# Patient Record
Sex: Male | Born: 1945 | Race: White | Hispanic: No | Smoking: Former smoker
Health system: Southern US, Community
[De-identification: ages and names within clinical notes are randomized; demographics above are authoritative.]

## PROBLEM LIST (undated history)

## (undated) DIAGNOSIS — G4730 Sleep apnea, unspecified: Secondary | ICD-10-CM

## (undated) DIAGNOSIS — H269 Unspecified cataract: Secondary | ICD-10-CM

## (undated) DIAGNOSIS — T7840XA Allergy, unspecified, initial encounter: Secondary | ICD-10-CM

## (undated) DIAGNOSIS — E785 Hyperlipidemia, unspecified: Secondary | ICD-10-CM

## (undated) DIAGNOSIS — J45909 Unspecified asthma, uncomplicated: Secondary | ICD-10-CM

## (undated) HISTORY — DX: Unspecified cataract: H26.9

## (undated) HISTORY — PX: POLYPECTOMY: SHX149

## (undated) HISTORY — PX: COLONOSCOPY: SHX174

## (undated) HISTORY — DX: Hyperlipidemia, unspecified: E78.5

## (undated) HISTORY — DX: Allergy, unspecified, initial encounter: T78.40XA

## (undated) HISTORY — PX: INGUINAL HERNIA REPAIR: SUR1180

## (undated) HISTORY — DX: Sleep apnea, unspecified: G47.30

---

## 2003-04-25 ENCOUNTER — Encounter (INDEPENDENT_AMBULATORY_CARE_PROVIDER_SITE_OTHER): Payer: Self-pay | Admitting: Specialist

## 2003-04-25 ENCOUNTER — Ambulatory Visit (HOSPITAL_COMMUNITY): Admission: RE | Admit: 2003-04-25 | Discharge: 2003-04-25 | Payer: Self-pay | Admitting: Gastroenterology

## 2004-04-03 ENCOUNTER — Ambulatory Visit: Payer: Self-pay | Admitting: Gastroenterology

## 2004-04-16 ENCOUNTER — Ambulatory Visit: Payer: Self-pay | Admitting: Gastroenterology

## 2007-05-02 ENCOUNTER — Ambulatory Visit: Payer: Self-pay | Admitting: Gastroenterology

## 2007-05-16 ENCOUNTER — Ambulatory Visit: Payer: Self-pay | Admitting: Gastroenterology

## 2007-05-16 ENCOUNTER — Encounter: Payer: Self-pay | Admitting: Gastroenterology

## 2012-05-24 ENCOUNTER — Encounter: Payer: Self-pay | Admitting: Gastroenterology

## 2012-05-30 ENCOUNTER — Encounter: Payer: Self-pay | Admitting: Gastroenterology

## 2012-06-28 ENCOUNTER — Ambulatory Visit (AMBULATORY_SURGERY_CENTER): Payer: 59 | Admitting: *Deleted

## 2012-06-28 VITALS — Ht 72.0 in | Wt 230.2 lb

## 2012-06-28 DIAGNOSIS — Z1211 Encounter for screening for malignant neoplasm of colon: Secondary | ICD-10-CM

## 2012-06-28 MED ORDER — NA SULFATE-K SULFATE-MG SULF 17.5-3.13-1.6 GM/177ML PO SOLN: ORAL | Status: DC

## 2012-06-29 ENCOUNTER — Encounter: Payer: Self-pay | Admitting: Gastroenterology

## 2012-07-13 ENCOUNTER — Ambulatory Visit (AMBULATORY_SURGERY_CENTER): Payer: 59 | Admitting: Gastroenterology

## 2012-07-13 ENCOUNTER — Encounter: Payer: Self-pay | Admitting: Gastroenterology

## 2012-07-13 VITALS — BP 133/73 | HR 59 | Temp 97.0°F | Resp 26 | Ht 72.0 in | Wt 230.0 lb

## 2012-07-13 DIAGNOSIS — Z1211 Encounter for screening for malignant neoplasm of colon: Secondary | ICD-10-CM

## 2012-07-13 DIAGNOSIS — D126 Benign neoplasm of colon, unspecified: Secondary | ICD-10-CM

## 2012-07-13 MED ORDER — SODIUM CHLORIDE 0.9 % IV SOLN: 500.0000 mL | INTRAVENOUS | Status: DC

## 2012-07-13 NOTE — Progress Notes (Signed)
Patient did not experience any of the following events: a burn prior to discharge; a fall within the facility; wrong site/side/patient/procedure/implant event; or a hospital transfer or hospital admission upon discharge from the facility. (G8907) Patient did not have preoperative order for IV antibiotic SSI prophylaxis. (G8918)  

## 2012-07-13 NOTE — Progress Notes (Signed)
Called to room to assist during endoscopic procedure.  Patient ID and intended procedure confirmed with present staff. Received instructions for my participation in the procedure from the performing physician.  

## 2012-07-13 NOTE — Progress Notes (Signed)
Lidocaine-40mg IV prior to Propofol InductionPropofol given over incremental dosages 

## 2012-07-13 NOTE — Patient Instructions (Addendum)
Discharge instructions given with verbal understanding. Handout on polyp given. Resume previous medications. YOU HAD AN ENDOSCOPIC PROCEDURE TODAY AT THE [facility]: Refer to the procedure report that was given to you for any specific questions about what was found during the examination.  If the procedure report does not answer your questions, please call your gastroenterologist to clarify.  If you requested that your care partner not be given the details of your procedure findings, then the procedure report has been included in a sealed envelope for you to review at your convenience later.  YOU SHOULD EXPECT: Some feelings of bloating in the abdomen. Passage of more gas than usual.  Walking can help get rid of the air that was put into your GI tract during the procedure and reduce the bloating. If you had a lower endoscopy (such as a colonoscopy or flexible sigmoidoscopy) you may notice spotting of blood in your stool or on the toilet paper. If you underwent a bowel prep for your procedure, then you may not have a normal bowel movement for a few days.  DIET: Your first meal following the procedure should be a light meal and then it is ok to progress to your normal diet.  A half-sandwich or bowl of soup is an example of a good first meal.  Heavy or fried foods are harder to digest and may make you feel nauseous or bloated.  Likewise meals heavy in dairy and vegetables can cause extra gas to form and this can also increase the bloating.  Drink plenty of fluids but you should avoid alcoholic beverages for 24 hours.  ACTIVITY: Your care partner should take you home directly after the procedure.  You should plan to take it easy, moving slowly for the rest of the day.  You can resume normal activity the day after the procedure however you should NOT DRIVE or use heavy machinery for 24 hours (because of the sedation medicines used during the test).    SYMPTOMS TO REPORT IMMEDIATELY: A  gastroenterologist can be reached at any hour.  During normal business hours, 8:30 AM to 5:00 PM Monday through Friday, call [phone].  After hours and on weekends, please call the GI answering service at [phone] who will take a message and have the physician on call contact you.   Following lower endoscopy (colonoscopy or flexible sigmoidoscopy):  Excessive amounts of blood in the stool  Significant tenderness or worsening of abdominal pains  Swelling of the abdomen that is new, acute  Fever of 100F or higher   FOLLOW UP: If any biopsies were taken you will be contacted by phone or by letter within the next 1-3 weeks.  Call your gastroenterologist if you have not heard about the biopsies in 3 weeks.  Our staff will call the home number listed on your records the next business day following your procedure to check on you and address any questions or concerns that you may have at that time regarding the information given to you following your procedure. This is a courtesy call and so if there is no answer at the home number and we have not heard from you through the emergency physician on call, we will assume that you have returned to your regular daily activities without incident.  SIGNATURES/CONFIDENTIALITY: You and/or your care partner have signed paperwork which will be entered into your electronic medical record.  These signatures attest to the fact that that the information above on your After Visit Summary   has been reviewed and is understood.  Full responsibility of the confidentiality of this discharge information lies with you and/or your care-partner.  

## 2012-07-13 NOTE — Op Note (Addendum)
[facility] [address] [place], [address]   COLONOSCOPY PROCEDURE REPORT  PATIENT: Cody Michael, Cody Michael  MR#: [id number] BIRTHDATE: 1945/10/07 , 67  yrs. old GENDER: Male ENDOSCOPIST: Louis Meckel, MD REFERRED BY: Dr. Diannia Ruder Husin PROCEDURE DATE:  07/13/2012 PROCEDURE:   Colonoscopy with snare polypectomy ASA CLASS:   Class I INDICATIONS:average risk screening. MEDICATIONS: MAC sedation, administered by CRNA and propofol (Diprivan) 250mg  IV  DESCRIPTION OF PROCEDURE:   After the risks benefits and alternatives of the procedure were thoroughly explained, informed consent was obtained.  A digital rectal exam revealed no abnormalities of the rectum.   The LB [id number]  endoscope was introduced through the anus and advanced to the cecum, which was identified by both the appendix and ileocecal valve. No adverse events experienced.   The quality of the prep was excellent using Suprep  The instrument was then slowly withdrawn as the colon was fully examined.      COLON FINDINGS: Two sessile polyps measuring 3-10 mm in size were found in the descending colon.  A polypectomy was performed with a cold snare.  The resection was complete and the polyp tissue was completely retrieved.   The colon mucosa was otherwise normal. Retroflexed views revealed no abnormalities. The time to cecum=3 minutes 12 seconds.  Withdrawal time=8 minutes 20 seconds.  The scope was withdrawn and the procedure completed. COMPLICATIONS: There were no complications.  ENDOSCOPIC IMPRESSION: 1.   Two sessile polyps measuring 3-10 mm in size were found in the descending colon; polypectomy was performed with a cold snare 2.   The colon mucosa was otherwise normal  RECOMMENDATIONS: If the polyp(s) removed today are proven to be adenomatous (pre-cancerous) polyps, you will need a repeat colonoscopy in 5 years.  Otherwise you should continue to follow colorectal cancer screening  guidelines for "routine risk" patients with colonoscopy in 10 years.  You will receive a letter within 1-2 weeks with the results of your biopsy as well as final recommendations.  Please call my office if you have not received a letter after 3 weeks.   eSigned:  Louis Meckel, MD 07/13/2012 9:49 AM Revised: 07/13/2012 9:49 AM  cc:   PATIENT NAME:  [name], [name] MR#: [id number]

## 2012-07-14 ENCOUNTER — Telehealth: Payer: Self-pay

## 2012-07-14 NOTE — Telephone Encounter (Signed)
  Follow up Call-  Call back number 07/13/2012  Post procedure Call Back phone  # [phone]  Permission to leave phone message Yes     Patient questions:  Do you have a fever, pain , or abdominal swelling? no Pain Score  0 *  Have you tolerated food without any problems? yes  Have you been able to return to your normal activities? yes  Do you have any questions about your discharge instructions: Diet   no Medications  no Follow up visit  no  Do you have questions or concerns about your Care? no  Actions: * If pain score is 4 or above: No action needed, pain <4.

## 2012-07-24 ENCOUNTER — Encounter: Payer: Self-pay | Admitting: Gastroenterology

## 2014-01-14 ENCOUNTER — Telehealth: Payer: Self-pay | Admitting: *Deleted

## 2014-01-14 NOTE — Telephone Encounter (Signed)
Pt's wife called states she would like to order 2nd pr of orthotics from [facility] # [phone].  Orders called to [facility] at [facility] [phone].

## 2014-02-20 DIAGNOSIS — M722 Plantar fascial fibromatosis: Secondary | ICD-10-CM

## 2014-05-13 ENCOUNTER — Other Ambulatory Visit: Payer: Self-pay | Admitting: Internal Medicine

## 2014-05-13 DIAGNOSIS — R0989 Other specified symptoms and signs involving the circulatory and respiratory systems: Secondary | ICD-10-CM

## 2014-05-13 DIAGNOSIS — Z136 Encounter for screening for cardiovascular disorders: Secondary | ICD-10-CM

## 2014-05-22 ENCOUNTER — Ambulatory Visit
Admission: RE | Admit: 2014-05-22 | Discharge: 2014-05-22 | Disposition: A | Discharge status: Home / Self Care | Payer: 59 | Source: Ambulatory Visit | Attending: Internal Medicine | Admitting: Internal Medicine

## 2014-05-22 DIAGNOSIS — Z136 Encounter for screening for cardiovascular disorders: Secondary | ICD-10-CM

## 2014-05-22 DIAGNOSIS — R0989 Other specified symptoms and signs involving the circulatory and respiratory systems: Secondary | ICD-10-CM

## 2017-06-29 ENCOUNTER — Encounter: Payer: Self-pay | Admitting: Gastroenterology

## 2017-07-06 ENCOUNTER — Encounter: Payer: Self-pay | Admitting: Gastroenterology

## 2017-08-31 ENCOUNTER — Ambulatory Visit (AMBULATORY_SURGERY_CENTER): Payer: Self-pay

## 2017-08-31 ENCOUNTER — Other Ambulatory Visit: Payer: Self-pay

## 2017-08-31 VITALS — Ht 72.0 in | Wt 239.8 lb

## 2017-08-31 DIAGNOSIS — Z86010 Personal history of colonic polyps: Secondary | ICD-10-CM

## 2017-08-31 MED ORDER — NA SULFATE-K SULFATE-MG SULF 17.5-3.13-1.6 GM/177ML PO SOLN: 1.0000 | Freq: Once | ORAL | 0 refills | Status: AC

## 2017-08-31 NOTE — Progress Notes (Signed)
No egg or soy allergy known to patient  No issues with past sedation with any surgeries  or procedures, no intubation problems  No diet pills per patient No home 02 use per patient  No blood thinners per patient  Pt denies issues with constipation  No A fib or A flutter  EMMI video sent to pt's e mail , pt declined    

## 2017-09-01 ENCOUNTER — Encounter: Payer: Self-pay | Admitting: Gastroenterology

## 2017-09-14 ENCOUNTER — Ambulatory Visit (AMBULATORY_SURGERY_CENTER): Payer: Medicare Other | Admitting: Gastroenterology

## 2017-09-14 VITALS — BP 127/73 | HR 64 | Temp 98.7°F | Resp 16

## 2017-09-14 DIAGNOSIS — D125 Benign neoplasm of sigmoid colon: Secondary | ICD-10-CM | POA: Diagnosis not present

## 2017-09-14 DIAGNOSIS — Z86010 Personal history of colonic polyps: Secondary | ICD-10-CM

## 2017-09-14 DIAGNOSIS — D120 Benign neoplasm of cecum: Secondary | ICD-10-CM | POA: Diagnosis not present

## 2017-09-14 DIAGNOSIS — D123 Benign neoplasm of transverse colon: Secondary | ICD-10-CM

## 2017-09-14 DIAGNOSIS — D122 Benign neoplasm of ascending colon: Secondary | ICD-10-CM | POA: Diagnosis not present

## 2017-09-14 MED ORDER — SODIUM CHLORIDE 0.9 % IV SOLN: 500.0000 mL | Freq: Once | INTRAVENOUS | Status: DC

## 2017-09-14 NOTE — Op Note (Signed)
[facility] Patient Name: Cody Michael Procedure Date: 09/14/2017 9:03 AM MRN: [id number] Endoscopist: Remo Lipps P. Havery Moros , MD Age: 72 Referring MD:  Date of Birth: [birth year] Gender: Male Account #: [id number] Procedure:                Colonoscopy Indications:              Surveillance: Personal history of adenomatous                            polyps on last colonoscopy 5 years ago Medicines:                Monitored Anesthesia Care Procedure:                Pre-Anesthesia Assessment:                           - Prior to the procedure, a History and Physical                            was performed, and patient medications and                            allergies were reviewed. The patient's tolerance of                            previous anesthesia was also reviewed. The risks                            and benefits of the procedure and the sedation                            options and risks were discussed with the patient.                            All questions were answered, and informed consent                            was obtained. Prior Anticoagulants: The patient has                            taken no previous anticoagulant or antiplatelet                            agents. ASA Grade Assessment: II - A patient with                            mild systemic disease. After reviewing the risks                            and benefits, the patient was deemed in                            satisfactory condition to undergo the procedure.  After obtaining informed consent, the colonoscope                            was passed under direct vision. Throughout the                            procedure, the patient's blood pressure, pulse, and                            oxygen saturations were monitored continuously. The                            Colonoscope was introduced through the anus and                            advanced to the the  cecum, identified by                            appendiceal orifice and ileocecal valve. The                            colonoscopy was performed without difficulty. The                            patient tolerated the procedure well. The quality                            of the bowel preparation was good. The ileocecal                            valve, appendiceal orifice, and rectum were                            photographed. Scope In: 9:09:52 AM Scope Out: 9:51:16 AM Scope Withdrawal Time: 0 hours 37 minutes 9 seconds  Total Procedure Duration: 0 hours 41 minutes 24 seconds  Findings:                 The perianal and digital rectal examinations were                            normal.                           A 3 mm polyp was found in the cecum. The polyp was                            sessile. The polyp was removed with a cold snare.                            Resection and retrieval were complete.                           Two sessile polyps were found in the ascending  colon. The polyps were diminutive in size. These                            polyps were removed with a cold biopsy forceps.                            Resection and retrieval were complete.                           Nine sessile polyps were found in the ascending                            colon. The polyps were 3 to 5 mm in size. These                            polyps were removed with a cold snare. Resection                            and retrieval were complete.                           Eight sessile polyps were found in the transverse                            colon. The polyps were 3 to 7 mm in size. These                            polyps were removed with a cold snare. Resection                            and retrieval were complete.                           Multiple small-mouthed diverticula were found in                            the sigmoid colon, descending colon,  ascending                            colon and cecum.                           A 4 mm polyp was found in the sigmoid colon. The                            polyp was sessile. The polyp was removed with a                            cold snare. Resection and retrieval were complete.                           The right colon was tortous. The exam was otherwise  without abnormality. Complications:            No immediate complications. Estimated blood loss:                            Minimal. Estimated Blood Loss:     Estimated blood loss was minimal. Impression:               - One 3 mm polyp in the cecum, removed with a cold                            snare. Resected and retrieved.                           - Two diminutive polyps in the ascending colon,                            removed with a cold biopsy forceps. Resected and                            retrieved.                           - Nine 3 to 5 mm polyps in the ascending colon,                            removed with a cold snare. Resected and retrieved.                           - Eight 3 to 7 mm polyps in the transverse colon,                            removed with a cold snare. Resected and retrieved.                           - Diverticulosis in the sigmoid colon, in the                            descending colon, in the ascending colon and in the                            cecum.                           - One 4 mm polyp in the sigmoid colon, removed with                            a cold snare. Resected and retrieved.                           - The examination was otherwise normal. Recommendation:           - Patient has a contact number available for  emergencies. The signs and symptoms of potential                            delayed complications were discussed with the                            patient. Return to normal activities tomorrow.                             Written discharge instructions were provided to the                            patient.                           - Resume previous diet.                           - Continue present medications.                           - Await pathology results.                           - Repeat colonoscopy for surveillance based on                            pathology results. Remo Lipps P. Lawarence Meek, MD 09/14/2017 9:57:41 AM This report has been signed electronically.

## 2017-09-14 NOTE — Progress Notes (Signed)
Called to room to assist during endoscopic procedure.  Patient ID and intended procedure confirmed with present staff. Received instructions for my participation in the procedure from the performing physician.  

## 2017-09-14 NOTE — Progress Notes (Signed)
Pt's states no medical or surgical changes since previsit or office visit. 

## 2017-09-14 NOTE — Progress Notes (Signed)
Report to PACU, RN, vss, BBS= Clear.  

## 2017-09-14 NOTE — Patient Instructions (Signed)
   Information on polyps and diverticulosis given to you today  Await pathology results on polyps removed today   YOU HAD AN ENDOSCOPIC PROCEDURE TODAY AT THE [facility]:   Refer to the procedure report that was given to you for any specific questions about what was found during the examination.  If the procedure report does not answer your questions, please call your gastroenterologist to clarify.  If you requested that your care partner not be given the details of your procedure findings, then the procedure report has been included in a sealed envelope for you to review at your convenience later.  YOU SHOULD EXPECT: Some feelings of bloating in the abdomen. Passage of more gas than usual.  Walking can help get rid of the air that was put into your GI tract during the procedure and reduce the bloating. If you had a lower endoscopy (such as a colonoscopy or flexible sigmoidoscopy) you may notice spotting of blood in your stool or on the toilet paper. If you underwent a bowel prep for your procedure, you may not have a normal bowel movement for a few days.  Please Note:  You might notice some irritation and congestion in your nose or some drainage.  This is from the oxygen used during your procedure.  There is no need for concern and it should clear up in a day or so.  SYMPTOMS TO REPORT IMMEDIATELY:   Following lower endoscopy (colonoscopy or flexible sigmoidoscopy):  Excessive amounts of blood in the stool  Significant tenderness or worsening of abdominal pains  Swelling of the abdomen that is new, acute  Fever of 100F or higher    For urgent or emergent issues, a gastroenterologist can be reached at any hour by calling [phone].   DIET:  We do recommend a small meal at first, but then you may proceed to your regular diet.  Drink plenty of fluids but you should avoid alcoholic beverages for 24 hours.  ACTIVITY:  You should plan to take it easy for the rest of today  and you should NOT DRIVE or use heavy machinery until tomorrow (because of the sedation medicines used during the test).    FOLLOW UP: Our staff will call the number listed on your records the next business day following your procedure to check on you and address any questions or concerns that you may have regarding the information given to you following your procedure. If we do not reach you, we will leave a message.  However, if you are feeling well and you are not experiencing any problems, there is no need to return our call.  We will assume that you have returned to your regular daily activities without incident.  If any biopsies were taken you will be contacted by phone or by letter within the next 1-3 weeks.  Please call us at [phone] if you have not heard about the biopsies in 3 weeks.    SIGNATURES/CONFIDENTIALITY: You and/or your care partner have signed paperwork which will be entered into your electronic medical record.  These signatures attest to the fact that that the information above on your After Visit Summary has been reviewed and is understood.  Full responsibility of the confidentiality of this discharge information lies with you and/or your care-partner. 

## 2017-09-15 ENCOUNTER — Telehealth: Payer: Self-pay

## 2017-09-15 NOTE — Telephone Encounter (Signed)
  Follow up Call-  Call back number 09/14/2017  Post procedure Call Back phone  # [phone]  Permission to leave phone message Yes  Some recent data might be hidden     Patient questions:  Do you have a fever, pain , or abdominal swelling? No. Pain Score  0 *  Have you tolerated food without any problems? Yes.    Have you been able to return to your normal activities? Yes.    Do you have any questions about your discharge instructions: Diet   No. Medications  No. Follow up visit  No.  Do you have questions or concerns about your Care? No.  Actions: * If pain score is 4 or above: No action needed, pain <4.

## 2018-09-04 ENCOUNTER — Encounter: Payer: Self-pay | Admitting: Gastroenterology

## 2018-09-11 ENCOUNTER — Encounter: Payer: Self-pay | Admitting: Gastroenterology

## 2018-09-19 ENCOUNTER — Other Ambulatory Visit: Payer: Self-pay

## 2018-09-19 ENCOUNTER — Ambulatory Visit (AMBULATORY_SURGERY_CENTER): Payer: Self-pay

## 2018-09-19 VITALS — Temp 97.7°F | Ht 72.0 in | Wt 237.0 lb

## 2018-09-19 DIAGNOSIS — D369 Benign neoplasm, unspecified site: Secondary | ICD-10-CM

## 2018-09-19 MED ORDER — SUPREP BOWEL PREP KIT 17.5-3.13-1.6 GM/177ML PO SOLN: 1.0000 | Freq: Once | ORAL | 0 refills | Status: AC

## 2018-09-19 NOTE — Progress Notes (Signed)
Per pt, no allergies to soy or egg products.Pt not taking any weight loss meds or using  O2 at home.  Pt denies sedation problems  Pt refused emmi video.

## 2018-09-26 ENCOUNTER — Telehealth: Payer: Self-pay | Admitting: Podiatry

## 2018-09-26 NOTE — Telephone Encounter (Signed)
pts wife called and left message on 8.17 asking about getting another pair of orthotics for pt.  Last pair I seen ordered in the chart was 2015 from [facility].   I talked with pts wife and told her I would have to talk with Liliane Channel and Dr Paulla Dolly since it had been so long and we are not using [employer] orthotics. I did explain the 398.00 cost and that if they are wanting to bill the insurance the pt would have to come in for a office visit.

## 2018-10-04 ENCOUNTER — Encounter: Payer: Self-pay | Admitting: Gastroenterology

## 2018-10-09 ENCOUNTER — Telehealth: Payer: Self-pay

## 2018-10-09 NOTE — Telephone Encounter (Signed)
Covid-19 screening questions   Do you now or have you had a fever in the last 14 days? NO   Do you have any respiratory symptoms of shortness of breath or cough now or in the last 14 days? NO   Do you have any family members or close contacts with diagnosed or suspected Covid-19 in the past 14 days? NO   Have you been tested for Covid-19 and found to be positive? NO     Verified with wife Tye [place].

## 2018-10-10 ENCOUNTER — Ambulatory Visit (AMBULATORY_SURGERY_CENTER): Payer: Medicare Other | Admitting: Gastroenterology

## 2018-10-10 ENCOUNTER — Encounter: Payer: Self-pay | Admitting: Gastroenterology

## 2018-10-10 ENCOUNTER — Other Ambulatory Visit: Payer: Self-pay

## 2018-10-10 VITALS — BP 115/67 | HR 59 | Temp 97.7°F | Resp 12 | Ht 72.0 in | Wt 237.0 lb

## 2018-10-10 DIAGNOSIS — D120 Benign neoplasm of cecum: Secondary | ICD-10-CM | POA: Diagnosis not present

## 2018-10-10 DIAGNOSIS — D122 Benign neoplasm of ascending colon: Secondary | ICD-10-CM | POA: Diagnosis not present

## 2018-10-10 DIAGNOSIS — Z86010 Personal history of colonic polyps: Secondary | ICD-10-CM

## 2018-10-10 DIAGNOSIS — D127 Benign neoplasm of rectosigmoid junction: Secondary | ICD-10-CM

## 2018-10-10 DIAGNOSIS — D123 Benign neoplasm of transverse colon: Secondary | ICD-10-CM | POA: Diagnosis not present

## 2018-10-10 DIAGNOSIS — D124 Benign neoplasm of descending colon: Secondary | ICD-10-CM

## 2018-10-10 HISTORY — PX: COLONOSCOPY: SHX174

## 2018-10-10 MED ORDER — SODIUM CHLORIDE 0.9 % IV SOLN: 500.0000 mL | Freq: Once | INTRAVENOUS | Status: DC

## 2018-10-10 NOTE — Patient Instructions (Signed)
YOU HAD AN ENDOSCOPIC PROCEDURE TODAY AT [facility]:   Refer to the procedure report that was given to you for any specific questions about what was found during the examination.  If the procedure report does not answer your questions, please call your gastroenterologist to clarify.  If you requested that your care partner not be given the details of your procedure findings, then the procedure report has been included in a sealed envelope for you to review at your convenience later.  YOU SHOULD EXPECT: Some feelings of bloating in the abdomen. Passage of more gas than usual.  Walking can help get rid of the air that was put into your GI tract during the procedure and reduce the bloating. If you had a lower endoscopy (such as a colonoscopy or flexible sigmoidoscopy) you may notice spotting of blood in your stool or on the toilet paper. If you underwent a bowel prep for your procedure, you may not have a normal bowel movement for a few days.  Please Note:  You might notice some irritation and congestion in your nose or some drainage.  This is from the oxygen used during your procedure.  There is no need for concern and it should clear up in a day or so.  SYMPTOMS TO REPORT IMMEDIATELY:   Following lower endoscopy (colonoscopy or flexible sigmoidoscopy):  Excessive amounts of blood in the stool  Significant tenderness or worsening of abdominal pains  Swelling of the abdomen that is new, acute  Fever of 100F or higher  For urgent or emergent issues, a gastroenterologist can be reached at any hour by calling [phone].   DIET:  We do recommend a small meal at first, but then you may proceed to your regular diet.  Drink plenty of fluids but you should avoid alcoholic beverages for 24 hours.  ACTIVITY:  You should plan to take it easy for the rest of today and you should NOT DRIVE or use heavy machinery until tomorrow (because of the sedation medicines used during the test).     FOLLOW UP: Our staff will call the number listed on your records 48-72 hours following your procedure to check on you and address any questions or concerns that you may have regarding the information given to you following your procedure. If we do not reach you, we will leave a message.  We will attempt to reach you two times.  During this call, we will ask if you have developed any symptoms of COVID 19. If you develop any symptoms (ie: fever, flu-like symptoms, shortness of breath, cough etc.) before then, please call [phone].  If you test positive for Covid 19 in the 2 weeks post procedure, please call and report this information to [place].    If any biopsies were taken you will be contacted by phone or by letter within the next 1-3 weeks.  Please call us at [phone] if you have not heard about the biopsies in 3 weeks.   SIGNATURES/CONFIDENTIALITY: You and/or your care partner have signed paperwork which will be entered into your electronic medical record.  These signatures attest to the fact that that the information above on your After Visit Summary has been reviewed and is understood.  Full responsibility of the confidentiality of this discharge information lies with you and/or your care-partner.  Await pathology  Please read over handouts about polyps, diverticulosis and hemorrhoids  Continue your normal medications

## 2018-10-10 NOTE — Progress Notes (Signed)
Called to room to assist during endoscopic procedure.  Patient ID and intended procedure confirmed with present staff. Received instructions for my participation in the procedure from the performing physician.  

## 2018-10-10 NOTE — Op Note (Signed)
[facility] Patient Name: Cody Michael Procedure Date: 10/10/2018 7:53 AM MRN: [id number] Endoscopist: Remo Lipps P. Havery Moros , MD Age: 73 Referring MD:  Date of Birth: 1945-07-06 Gender: Male Account #: [id number] Procedure:                Colonoscopy Indications:              Surveillance: History of numerous (> 20) adenomas                            on last colonoscopy (2019) - patient declined                            genetic testing previously Medicines:                Monitored Anesthesia Care Procedure:                Pre-Anesthesia Assessment:                           - Prior to the procedure, a History and Physical                            was performed, and patient medications and                            allergies were reviewed. The patient's tolerance of                            previous anesthesia was also reviewed. The risks                            and benefits of the procedure and the sedation                            options and risks were discussed with the patient.                            All questions were answered, and informed consent                            was obtained. Prior Anticoagulants: The patient has                            taken no previous anticoagulant or antiplatelet                            agents. ASA Grade Assessment: II - A patient with                            mild systemic disease. After reviewing the risks                            and benefits, the patient was deemed in  satisfactory condition to undergo the procedure.                           After obtaining informed consent, the colonoscope                            was passed under direct vision. Throughout the                            procedure, the patient's blood pressure, pulse, and                            oxygen saturations were monitored continuously. The                            Colonoscope was introduced through  the anus and                            advanced to the the cecum, identified by                            appendiceal orifice and ileocecal valve. The                            colonoscopy was performed without difficulty. The                            patient tolerated the procedure well. The quality                            of the bowel preparation was good. The ileocecal                            valve, appendiceal orifice, and rectum were                            photographed. Scope In: 8:00:49 AM Scope Out: 8:27:36 AM Scope Withdrawal Time: 0 hours 22 minutes 59 seconds  Total Procedure Duration: 0 hours 26 minutes 47 seconds  Findings:                 The perianal and digital rectal examinations were                            normal.                           Two sessile polyps were found in the cecum. The                            polyps were diminutive in size. These polyps were                            removed with a cold snare. Resection and retrieval  were complete.                           Two sessile polyps were found in the ascending                            colon. The polyps were diminutive in size. These                            polyps were removed with a cold snare. Resection                            and retrieval were complete.                           Four sessile polyps were found in the transverse                            colon. The polyps were 3 to 4 mm in size. These                            polyps were removed with a cold snare. Resection                            and retrieval were complete.                           Two sessile polyps were found in the descending                            colon. The polyps were 3 to 4 mm in size. These                            polyps were removed with a cold snare. Resection                            and retrieval were complete.                           A 4 mm polyp was  found in the recto-sigmoid colon.                            The polyp was flat. The polyp was removed with a                            cold snare. Resection and retrieval were complete.                           Scattered small-mouthed diverticula were found in                            the entire colon.  Internal hemorrhoids were found during retroflexion.                           The exam was otherwise without abnormality. Complications:            No immediate complications. Estimated blood loss:                            Minimal. Estimated Blood Loss:     Estimated blood loss was minimal. Impression:               - Two diminutive polyps in the cecum, removed with                            a cold snare. Resected and retrieved.                           - Two diminutive polyps in the ascending colon,                            removed with a cold snare. Resected and retrieved.                           - Four 3 to 4 mm polyps in the transverse colon,                            removed with a cold snare. Resected and retrieved.                           - Two 3 to 4 mm polyps in the descending colon,                            removed with a cold snare. Resected and retrieved.                           - One 4 mm polyp at the recto-sigmoid colon,                            removed with a cold snare. Resected and retrieved.                           - Diverticulosis in the entire examined colon.                           - Internal hemorrhoids.                           - The examination was otherwise normal. Recommendation:           - Patient has a contact number available for                            emergencies. The signs and symptoms of potential  delayed complications were discussed with the                            patient. Return to normal activities tomorrow.                            Written discharge instructions  were provided to the                            patient.                           - Resume previous diet.                           - Continue present medications.                           - Await pathology results. Remo Lipps P. Chadwin Fury, MD 10/10/2018 8:35:12 AM This report has been signed electronically.

## 2018-10-10 NOTE — Progress Notes (Signed)
Pt's states no medical or surgical changes since previsit or office visit.  Temp-June bullock  Vital signs-courtney washington 

## 2018-10-10 NOTE — Progress Notes (Signed)
PT taken to PACU. Monitors in place. VSS. Report given to RN. 

## 2018-10-12 ENCOUNTER — Telehealth: Payer: Self-pay | Admitting: *Deleted

## 2018-10-12 NOTE — Telephone Encounter (Signed)
  Follow up Call-  Call back number 10/10/2018 09/14/2017  Post procedure Call Back phone  # [phone]  Permission to leave phone message Yes Yes  Some recent data might be hidden     Patient questions:  Do you have a fever, pain , or abdominal swelling? No. Pain Score  0 *  Have you tolerated food without any problems? Yes.    Have you been able to return to your normal activities? Yes.    Do you have any questions about your discharge instructions: Diet   No. Medications  No. Follow up visit  No.  Do you have questions or concerns about your Care? No.  Actions: * If pain score is 4 or above: No action needed, pain <4.  1. Have you developed a fever since your procedure? NO  2.   Have you had an respiratory symptoms (SOB or cough) since your procedure? NO  3.   Have you tested positive for COVID 19 since your procedure NO  4.   Have you had any family members/close contacts diagnosed with the COVID 19 since your procedure?  NO   If yes to any of these questions please route to Joylene John, RN and Alphonsa Gin, RN.

## 2018-10-20 ENCOUNTER — Encounter: Payer: Self-pay | Admitting: Gastroenterology

## 2020-07-14 ENCOUNTER — Other Ambulatory Visit: Payer: Self-pay

## 2020-07-14 ENCOUNTER — Ambulatory Visit (AMBULATORY_SURGERY_CENTER): Payer: Medicare Other | Admitting: *Deleted

## 2020-07-14 VITALS — Ht 72.0 in | Wt 220.0 lb

## 2020-07-14 DIAGNOSIS — Z86010 Personal history of colonic polyps: Secondary | ICD-10-CM

## 2020-07-14 MED ORDER — NA SULFATE-K SULFATE-MG SULF 17.5-3.13-1.6 GM/177ML PO SOLN: ORAL | 0 refills | Status: DC

## 2020-07-14 NOTE — Progress Notes (Signed)
Patient's pre-visit was done today over the phone with the patient due to COVID-19 pandemic. Name,DOB and address verified. Insurance verified. Patient denies any allergies to Eggs and Soy. Patient denies any problems with anesthesia/sedation. Patient denies taking diet pills or blood thinners. Packet of Prep instructions mailed to patient including a copy of a consent form-pt is aware. Patient understands to call us back with any questions or concerns. Patient is aware of our care-partner policy and [id number] safety protocol. The patient is COVID-19 vaccinated, per patient.

## 2020-07-28 ENCOUNTER — Encounter: Payer: Self-pay | Admitting: Gastroenterology

## 2020-07-28 ENCOUNTER — Ambulatory Visit (AMBULATORY_SURGERY_CENTER): Payer: Medicare Other | Admitting: Gastroenterology

## 2020-07-28 ENCOUNTER — Other Ambulatory Visit: Payer: Self-pay

## 2020-07-28 VITALS — BP 114/63 | HR 73 | Temp 98.0°F | Resp 10 | Ht 72.0 in | Wt 220.0 lb

## 2020-07-28 DIAGNOSIS — Z86010 Personal history of colonic polyps: Secondary | ICD-10-CM | POA: Diagnosis not present

## 2020-07-28 DIAGNOSIS — D122 Benign neoplasm of ascending colon: Secondary | ICD-10-CM

## 2020-07-28 DIAGNOSIS — D120 Benign neoplasm of cecum: Secondary | ICD-10-CM | POA: Diagnosis not present

## 2020-07-28 DIAGNOSIS — D128 Benign neoplasm of rectum: Secondary | ICD-10-CM

## 2020-07-28 DIAGNOSIS — D123 Benign neoplasm of transverse colon: Secondary | ICD-10-CM | POA: Diagnosis not present

## 2020-07-28 MED ORDER — SODIUM CHLORIDE 0.9 % IV SOLN: 500.0000 mL | Freq: Once | INTRAVENOUS | Status: DC

## 2020-07-28 NOTE — Progress Notes (Signed)
A/ox3, pleased with MAC, report to RN 

## 2020-07-28 NOTE — Op Note (Addendum)
[facility] Patient Name: Cody Michael Procedure Date: 07/28/2020 7:52 AM MRN: [id number] Endoscopist: Remo Lipps P. Havery Moros , MD Age: 75 Referring MD:  Date of Birth: December 14, 1945 Gender: Male Account #: [id number] Procedure:                Colonoscopy Indications:              High risk colon cancer surveillance: Personal                            history of colonic polyps - 21 polyps removed                            09/2017,. 11 polyps removed 10/2018 - adenomas /                            sessile serrated polyps, negative genetic testing Medicines:                Monitored Anesthesia Care Procedure:                Pre-Anesthesia Assessment:                           - Prior to the procedure, a History and Physical                            was performed, and patient medications and                            allergies were reviewed. The patient's tolerance of                            previous anesthesia was also reviewed. The risks                            and benefits of the procedure and the sedation                            options and risks were discussed with the patient.                            All questions were answered, and informed consent                            was obtained. Prior Anticoagulants: The patient has                            taken no previous anticoagulant or antiplatelet                            agents. ASA Grade Assessment: III - A patient with                            severe systemic disease. After reviewing the risks  and benefits, the patient was deemed in                            satisfactory condition to undergo the procedure.                           After obtaining informed consent, the colonoscope                            was passed under direct vision. Throughout the                            procedure, the patient's blood pressure, pulse, and                            oxygen  saturations were monitored continuously. The                            Olympus CF-HQ190 [id number]) Colonoscope was                            introduced through the anus and advanced to the the                            cecum, identified by appendiceal orifice and                            ileocecal valve. The colonoscopy was performed                            without difficulty. The patient tolerated the                            procedure well. The quality of the bowel                            preparation was adequate. The ileocecal valve,                            appendiceal orifice, and rectum were photographed. Scope In: 8:01:24 AM Scope Out: 8:38:14 AM Scope Withdrawal Time: 0 hours 31 minutes 17 seconds  Total Procedure Duration: 0 hours 36 minutes 50 seconds  Findings:                 The perianal and digital rectal examinations were                            normal.                           Multiple small-mouthed diverticula were found in                            the sigmoid colon.  Two sessile polyps were found in the cecum. The                            polyps were 3 mm in size. These polyps were removed                            with a cold snare. Resection and retrieval were                            complete.                           Two flat polyps were found in the ascending colon.                            The polyps were 2 to 5 mm in size. These polyps                            were removed with a cold snare. Resection and                            retrieval were complete.                           Three sessile polyps were found in the transverse                            colon. The polyps were 3 to 4 mm in size. These                            polyps were removed with a cold snare. Resection                            and retrieval were complete.                           A 3 to 4 mm polyp was found in the rectum. The                             polyp was sessile. The polyp was removed with a                            cold snare. Resection and retrieval were complete.                           Internal hemorrhoids were found during retroflexion.                           The exam was otherwise without abnormality. The                            prep was adequate but several minutes spent  lavaging the colon to achieve adequate views. Complications:            No immediate complications. Estimated blood loss:                            Minimal. Estimated Blood Loss:     Estimated blood loss was minimal. Impression:               - Diverticulosis in the sigmoid colon.                           - Two 3 mm polyps in the cecum, removed with a cold                            snare. Resected and retrieved.                           - Two 2 to 5 mm polyps in the ascending colon,                            removed with a cold snare. Resected and retrieved.                           - Three 3 to 4 mm polyps in the transverse colon,                            removed with a cold snare. Resected and retrieved.                           - One 3 to 4 mm polyp in the rectum, removed with a                            cold snare. Resected and retrieved.                           - Internal hemorrhoids.                           - The examination was otherwise normal. Recommendation:           - Patient has a contact number available for                            emergencies. The signs and symptoms of potential                            delayed complications were discussed with the                            patient. Return to normal activities tomorrow.                            Written discharge instructions were provided to the  patient.                           - Resume previous diet.                           - Continue present medications.                            - Await pathology results. Remo Lipps P. Havery Moros, MD 07/28/2020 8:43:28 AM This report has been signed electronically.

## 2020-07-28 NOTE — Progress Notes (Signed)
Called to room to assist during endoscopic procedure.  Patient ID and intended procedure confirmed with present staff. Received instructions for my participation in the procedure from the performing physician.  

## 2020-07-28 NOTE — Patient Instructions (Signed)
YOU HAD AN ENDOSCOPIC PROCEDURE TODAY AT THE [facility]:   Refer to the procedure report that was given to you for any specific questions about what was found during the examination.  If the procedure report does not answer your questions, please call your gastroenterologist to clarify.  If you requested that your care partner not be given the details of your procedure findings, then the procedure report has been included in a sealed envelope for you to review at your convenience later.  YOU SHOULD EXPECT: Some feelings of bloating in the abdomen. Passage of more gas than usual.  Walking can help get rid of the air that was put into your GI tract during the procedure and reduce the bloating. If you had a lower endoscopy (such as a colonoscopy or flexible sigmoidoscopy) you may notice spotting of blood in your stool or on the toilet paper. If you underwent a bowel prep for your procedure, you may not have a normal bowel movement for a few days.  Please Note:  You might notice some irritation and congestion in your nose or some drainage.  This is from the oxygen used during your procedure.  There is no need for concern and it should clear up in a day or so.  SYMPTOMS TO REPORT IMMEDIATELY:   Following lower endoscopy (colonoscopy or flexible sigmoidoscopy):  Excessive amounts of blood in the stool  Significant tenderness or worsening of abdominal pains  Swelling of the abdomen that is new, acute  Fever of 100F or higher   Following upper endoscopy (EGD)  Vomiting of blood or coffee ground material  New chest pain or pain under the shoulder blades  Painful or persistently difficult swallowing  New shortness of breath  Fever of 100F or higher  Black, tarry-looking stools  For urgent or emergent issues, a gastroenterologist can be reached at any hour by calling [phone]. Do not use MyChart messaging for urgent concerns.    DIET:  We do recommend a small meal at first, but  then you may proceed to your regular diet.  Drink plenty of fluids but you should avoid alcoholic beverages for 24 hours.  ACTIVITY:  You should plan to take it easy for the rest of today and you should NOT DRIVE or use heavy machinery until tomorrow (because of the sedation medicines used during the test).    FOLLOW UP: Our staff will call the number listed on your records 48-72 hours following your procedure to check on you and address any questions or concerns that you may have regarding the information given to you following your procedure. If we do not reach you, we will leave a message.  We will attempt to reach you two times.  During this call, we will ask if you have developed any symptoms of COVID 19. If you develop any symptoms (ie: fever, flu-like symptoms, shortness of breath, cough etc.) before then, please call [phone].  If you test positive for Covid 19 in the 2 weeks post procedure, please call and report this information to us.    If any biopsies were taken you will be contacted by phone or by letter within the next 1-3 weeks.  Please call us at [phone] if you have not heard about the biopsies in 3 weeks.    SIGNATURES/CONFIDENTIALITY: You and/or your care partner have signed paperwork which will be entered into your electronic medical record.  These signatures attest to the fact that that the information above on   your After Visit Summary has been reviewed and is understood.  Full responsibility of the confidentiality of this discharge information lies with you and/or your care-partner. 

## 2020-07-28 NOTE — Progress Notes (Signed)
Vitals by NS.  Pt's states no medical or surgical changes since previsit or office visit.

## 2020-07-30 ENCOUNTER — Telehealth: Payer: Self-pay

## 2020-07-30 NOTE — Telephone Encounter (Signed)
  Follow up Call-  Call back number 07/28/2020 10/10/2018  Post procedure Call Back phone  # [phone]  Permission to leave phone message Yes Yes  Some recent data might be hidden     Patient questions:  Do you have a fever, pain , or abdominal swelling? No. Pain Score  0 *  Have you tolerated food without any problems? Yes.    Have you been able to return to your normal activities? Yes.    Do you have any questions about your discharge instructions: Diet   No. Medications  No. Follow up visit  No.  Do you have questions or concerns about your Care? No.  Actions: * If pain score is 4 or above: No action needed, pain <4.  Have you developed a fever since your procedure? no  2.   Have you had an respiratory symptoms (SOB or cough) since your procedure? no  3.   Have you tested positive for COVID 19 since your procedure no  4.   Have you had any family members/close contacts diagnosed with the COVID 19 since your procedure?  no   If yes to any of these questions please route to Joylene John, RN and Joella Prince, RN

## 2020-08-02 ENCOUNTER — Encounter: Payer: Self-pay | Admitting: Gastroenterology

## 2021-01-28 ENCOUNTER — Other Ambulatory Visit: Payer: Self-pay | Admitting: Internal Medicine

## 2021-01-28 DIAGNOSIS — I6529 Occlusion and stenosis of unspecified carotid artery: Secondary | ICD-10-CM

## 2021-01-28 DIAGNOSIS — E7800 Pure hypercholesterolemia, unspecified: Secondary | ICD-10-CM

## 2021-01-28 DIAGNOSIS — G4733 Obstructive sleep apnea (adult) (pediatric): Secondary | ICD-10-CM

## 2021-02-25 ENCOUNTER — Other Ambulatory Visit: Payer: Self-pay

## 2021-02-25 ENCOUNTER — Ambulatory Visit
Admission: RE | Admit: 2021-02-25 | Discharge: 2021-02-25 | Disposition: A | Discharge status: Home / Self Care | Payer: No Typology Code available for payment source | Source: Ambulatory Visit | Attending: Internal Medicine | Admitting: Internal Medicine

## 2021-02-25 DIAGNOSIS — E7800 Pure hypercholesterolemia, unspecified: Secondary | ICD-10-CM

## 2021-02-25 DIAGNOSIS — G4733 Obstructive sleep apnea (adult) (pediatric): Secondary | ICD-10-CM

## 2021-02-25 DIAGNOSIS — I6529 Occlusion and stenosis of unspecified carotid artery: Secondary | ICD-10-CM

## 2021-07-29 ENCOUNTER — Other Ambulatory Visit: Payer: Self-pay | Admitting: Internal Medicine

## 2021-07-29 DIAGNOSIS — R911 Solitary pulmonary nodule: Secondary | ICD-10-CM

## 2021-11-09 ENCOUNTER — Other Ambulatory Visit: Payer: Self-pay | Admitting: Internal Medicine

## 2021-11-09 DIAGNOSIS — R911 Solitary pulmonary nodule: Secondary | ICD-10-CM

## 2022-03-01 ENCOUNTER — Ambulatory Visit
Admission: RE | Admit: 2022-03-01 | Discharge: 2022-03-01 | Disposition: A | Discharge status: Home / Self Care | Payer: Medicare Other | Source: Ambulatory Visit | Attending: Internal Medicine | Admitting: Internal Medicine

## 2022-03-01 DIAGNOSIS — R911 Solitary pulmonary nodule: Secondary | ICD-10-CM

## 2022-03-25 NOTE — Progress Notes (Signed)
Synopsis: Referred for pulmonary nodule by Deland Pretty, MD  Subjective:   PATIENT ID: Cody Michael GENDER: male DOB: 24-Mar-1945, MRN: [id number]  Chief Complaint  Patient presents with   Consult    Abn CT chest 03/01/22.  No sx noted.     59yM with history of allergy, HLD, OSA on CPAP referred for gg pulmonary nodule 3m  He has no cough. No significant DOE relative to peers. No hemoptysis.   No recent bouts of pneumonia or URI.   RML nodule discovered incidentally on cardiac CT 02/25/21, follow up ct chest ordered   Otherwise pertinent review of systems is negative.  Father had lung cancer  He smoked for 25 years 1ppd, quit in 1985. 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 [place]   Past Medical History:  Diagnosis Date   Allergy    seasonal   Cataract    Hyperlipidemia    Sleep apnea    cpap     Family History  Problem Relation Age of Onset   Heart failure Mother    Lung cancer Father    Other Sister    Heart disease Brother    Other Brother    Colon cancer Neg Hx    Colitis Neg Hx    Esophageal cancer Neg Hx    Rectal cancer Neg Hx    Stomach cancer Neg Hx      Past Surgical History:  Procedure Laterality Date   COLONOSCOPY  10/10/2018   [facility]   INGUINAL HERNIA REPAIR     1983 left; 1993 right   POLYPECTOMY      Social History   Socioeconomic History   Marital status: Married    Spouse name: Not on file   Number of children: Not on file   Years of education: Not on file   Highest education level: Not on file  Occupational History   Not on file  Tobacco Use   Smoking status: Former    Types: Cigarettes    Quit date: 06/29/1983    Years since quitting: 38.7   Smokeless tobacco: Never  Vaping Use   Vaping Use: Never used  Substance and Sexual Activity   Alcohol use: No   Drug use: No   Sexual activity: Not on file  Other Topics Concern   Not on  file  Social History Narrative   Not on file   Social Determinants of Health   Financial Resource Strain: Not on file  Food Insecurity: Not on file  Transportation Needs: Not on file  Physical Activity: Not on file  Stress: Not on file  Social Connections: Not on file  Intimate Partner Violence: Not on file     No Known Allergies   Outpatient Medications Prior to Visit  Medication Sig Dispense Refill   aspirin 81 MG tablet Take 81 mg by mouth daily.     atorvastatin (LIPITOR) 10 MG tablet Take 10 mg by mouth daily.     BEE POLLEN PO Take by mouth daily.     cholecalciferol (VITAMIN D) 1000 UNITS tablet Take 5,000 Units by mouth daily.      Coenzyme Q10 (CO Q-10) 100 MG CAPS Take by mouth.     Multiple Vitamin (MULTIVITAMIN) tablet Take 1 tablet by mouth daily.     Multiple Vitamins-Minerals (OCUVITE ADULT 50+ PO) Take by mouth.     Omega-3 Fatty  Acids (FISH OIL ODOR-LESS PO) Take by mouth. Take 2 pills daily     Semaglutide, 2 MG/DOSE, (OZEMPIC, 2 MG/DOSE,) 8 MG/3ML SOPN Inject 2 mg into the skin once a week.     OZEMPIC, 1 MG/DOSE, 4 MG/3ML SOPN Inject into the skin. (Patient not taking: Reported on 03/26/2022)     No facility-administered medications prior to visit.       Objective:   Physical Exam:  General appearance: 77 y.o., male, NAD, conversant  Eyes: anicteric sclerae; PERRL, tracking appropriately HENT: NCAT; MMM Neck: Trachea midline; no lymphadenopathy, no JVD Lungs: CTAB, no crackles, no wheeze, with normal respiratory effort CV: RRR, no murmur  Abdomen: Soft, non-tender; non-distended, BS present  Extremities: No peripheral edema, warm Skin: Normal turgor and texture; no rash Psych: Appropriate affect Neuro: Alert and oriented to person and place, no focal deficit     Vitals:   03/26/22 1346  BP: 124/64  Pulse: 72  Temp: 97.7 F (36.5 C)  TempSrc: Oral  SpO2: 97%  Weight: 233 lb 3.2 oz (105.8 kg)  Height: 6' (1.829 m)   97% on RA BMI  Readings from Last 3 Encounters:  03/26/22 31.63 kg/m  07/28/20 29.84 kg/m  07/14/20 29.84 kg/m   Wt Readings from Last 3 Encounters:  03/26/22 233 lb 3.2 oz (105.8 kg)  07/28/20 220 lb (99.8 kg)  07/14/20 220 lb (99.8 kg)     CBC No results found for: "WBC", "RBC", "HGB", "HCT", "PLT", "MCV", "MCH", "MCHC", "RDW", "LYMPHSABS", "MONOABS", "EOSABS", "BASOSABS"   Chest Imaging: CT Chest 03/01/22 reviewed by me with paraseptal and centrilobular emphysema, new 15m ggo LLL  Pulmonary Functions Testing Results:     No data to display             Assessment & Plan:   # Upper lobe predominant centrilobular emphysema Though significant radiographic extent he seems pretty much asymptomatic in this regard  # [id number] groundglass nodule # Multiple pulmonary nodules Cancer risk 7-13% over next 2-4 years by the calculators.   Plan: - CT Chest in 6 months and clinic visit afterward     NaMaryjane HurterMD [facility] 03/26/2022 2:36 PM

## 2022-03-26 ENCOUNTER — Ambulatory Visit (INDEPENDENT_AMBULATORY_CARE_PROVIDER_SITE_OTHER): Payer: Medicare Other | Admitting: Student

## 2022-03-26 ENCOUNTER — Encounter: Payer: Self-pay | Admitting: Student

## 2022-03-26 VITALS — BP 124/64 | HR 72 | Temp 97.7°F | Ht 72.0 in | Wt 233.2 lb

## 2022-03-26 DIAGNOSIS — R918 Other nonspecific abnormal finding of lung field: Secondary | ICD-10-CM

## 2022-03-26 NOTE — Patient Instructions (Signed)
-   CT Chest in 6 months and clinic visit afterward

## 2022-09-13 ENCOUNTER — Ambulatory Visit
Admission: RE | Admit: 2022-09-13 | Discharge: 2022-09-13 | Disposition: A | Discharge status: Home / Self Care | Payer: Medicare Other | Source: Ambulatory Visit | Attending: Student | Admitting: Student

## 2022-09-13 DIAGNOSIS — R918 Other nonspecific abnormal finding of lung field: Secondary | ICD-10-CM

## 2022-10-15 ENCOUNTER — Other Ambulatory Visit: Payer: Self-pay | Admitting: Medical Genetics

## 2022-10-15 DIAGNOSIS — Z006 Encounter for examination for normal comparison and control in clinical research program: Secondary | ICD-10-CM

## 2022-10-18 ENCOUNTER — Other Ambulatory Visit
Admission: RE | Admit: 2022-10-18 | Discharge: 2022-10-18 | Disposition: A | Discharge status: Home / Self Care | Payer: Medicare Other | Source: Ambulatory Visit | Attending: Medical Genetics | Admitting: Medical Genetics

## 2022-10-18 DIAGNOSIS — Z006 Encounter for examination for normal comparison and control in clinical research program: Secondary | ICD-10-CM | POA: Insufficient documentation

## 2022-10-26 LAB — GENECONNECT MOLECULAR SCREEN: Genetic Analysis Overall Interpretation: NEGATIVE

## 2022-11-23 ENCOUNTER — Encounter: Payer: Self-pay | Admitting: Pulmonary Disease

## 2022-11-23 ENCOUNTER — Ambulatory Visit (INDEPENDENT_AMBULATORY_CARE_PROVIDER_SITE_OTHER): Payer: Medicare Other | Admitting: Pulmonary Disease

## 2022-11-23 VITALS — BP 128/72 | HR 60 | Ht 72.0 in | Wt 230.4 lb

## 2022-11-23 DIAGNOSIS — R918 Other nonspecific abnormal finding of lung field: Secondary | ICD-10-CM | POA: Diagnosis not present

## 2022-11-23 NOTE — Patient Instructions (Addendum)
Schedule for PFTs  Schedule for PET scan  Scheduled to follow-up with Dr. Tonia Brooms or Corliss Blacker for left lower lobe nodule  Continue graded exercises as tolerated  Call us with significant concerns  Options of further intervention will be a biopsy of the lesion, being able to go take it out or stereotactic radiation treatment

## 2022-11-24 NOTE — Progress Notes (Signed)
Cody Michael    [id number]    01-13-1946  Primary Care Physician:Pharr, Zollie Beckers, MD  Referring Physician: Merri Brunette, MD [address] [place],  [place] [address]  Chief complaint:   Patient follow-up for lung nodule  HPI:  Was seen by Dr. Thora Lance in February with a lung nodule with plans for repeat CT  Patient had a repeat CT showing slight increased prominence of the nodule  Denies any respiratory complaints Denies pain or discomfort Remains active  No cough, no chest pain or chest discomfort No weight loss  Reformed smoker, quit in 1985, 25-pack-year smoking history, worked in [employer].  Was a Chief Technology Officer  No respiratory infections  The nodule had been initially discovered on cardiac CT  His dad had lung cancer   Outpatient Encounter Medications as of 11/23/2022  Medication Sig   aspirin 81 MG tablet Take 81 mg by mouth daily.   atorvastatin (LIPITOR) 20 MG tablet Take 20 mg by mouth daily.   BEE POLLEN PO Take by mouth daily.   cholecalciferol (VITAMIN D) 1000 UNITS tablet Take 5,000 Units by mouth daily.    Multiple Vitamin (MULTIVITAMIN) tablet Take 1 tablet by mouth daily.   Multiple Vitamins-Minerals (OCUVITE ADULT 50+ PO) Take by mouth.   Omega-3 Fatty Acids (FISH OIL ODOR-LESS PO) Take by mouth. Take 2 pills daily   OZEMPIC, 1 MG/DOSE, 4 MG/3ML SOPN Inject into the skin.   Semaglutide, 2 MG/DOSE, (OZEMPIC, 2 MG/DOSE,) 8 MG/3ML SOPN Inject 2 mg into the skin once a week.   [DISCONTINUED] atorvastatin (LIPITOR) 10 MG tablet Take 10 mg by mouth daily.   [DISCONTINUED] Coenzyme Q10 (CO Q-10) 100 MG CAPS Take by mouth.   No facility-administered encounter medications on file as of 11/23/2022.    Allergies as of 11/23/2022   (No Known Allergies)    Past Medical History:  Diagnosis Date   Allergy    seasonal   Cataract    Hyperlipidemia    Sleep apnea    cpap    Past Surgical History:  Procedure Laterality Date    COLONOSCOPY  10/10/2018   [facility]   INGUINAL HERNIA REPAIR     1983 left; 1993 right   POLYPECTOMY      Family History  Problem Relation Age of Onset   Heart failure Mother    Lung cancer Father    Other Sister    Heart disease Brother    Other Brother    Colon cancer Neg Hx    Colitis Neg Hx    Esophageal cancer Neg Hx    Rectal cancer Neg Hx    Stomach cancer Neg Hx     Social History   Socioeconomic History   Marital status: Married    Spouse name: Not on file   Number of children: Not on file   Years of education: Not on file   Highest education level: Not on file  Occupational History   Not on file  Tobacco Use   Smoking status: Former    Current packs/day: 0.00    Types: Cigarettes    Quit date: 06/29/1983    Years since quitting: 39.4   Smokeless tobacco: Never  Vaping Use   Vaping status: Never Used  Substance and Sexual Activity   Alcohol use: No   Drug use: No   Sexual activity: Not on file  Other Topics Concern   Not on file  Social History Narrative  Not on file   Social Determinants of Health   Financial Resource Strain: Not on file  Food Insecurity: Not on file  Transportation Needs: Not on file  Physical Activity: Not on file  Stress: Not on file  Social Connections: Not on file  Intimate Partner Violence: Not on file    Review of Systems  Respiratory:  Negative for cough and shortness of breath.     Vitals:   11/23/22 0932  BP: 128/72  Pulse: 60  SpO2: 96%     Physical Exam Constitutional:      Appearance: Normal appearance.  HENT:     Head: Normocephalic.     Mouth/Throat:     Mouth: Mucous membranes are moist.  Eyes:     General: No scleral icterus. Cardiovascular:     Rate and Rhythm: Normal rate and regular rhythm.     Heart sounds: No murmur heard.    No friction rub.  Pulmonary:     Effort: No respiratory distress.     Breath sounds: No stridor. No wheezing or rhonchi.  Musculoskeletal:     Cervical  back: No rigidity or tenderness.  Neurological:     Mental Status: He is alert.  Psychiatric:        Mood and Affect: Mood normal.      Data Reviewed: CT scan was reviewed with the patient.  Most recent CT 09/13/2022 Compared with one performed March 01, 2022  Assessment:  Lung nodule with slightly more dense appearance compared to previous  Patient currently asymptomatic  Previous smoking history  Concern for neoplastic process, adenocarcinoma is a possibility  This was discussed with the patient today  Plan/Recommendations: Will schedule patient for PET scan  Options of management including biopsy, stereotactic radiation treatment, surgical removal were discussed during the visit today  Obtaining a PET scan will give [place] some guidance with respect to how active the lesion is and guide further therapy  Tentative follow-up will be scheduled for 3 months  Will schedule with one of our providers for interventional bronchoscopy  Encourage patient to call with any significant concerns   Virl Diamond MD [facility] 11/24/2022, 6:08 AM  CC: Merri Brunette, MD

## 2022-12-06 ENCOUNTER — Encounter (HOSPITAL_COMMUNITY)
Admission: RE | Admit: 2022-12-06 | Discharge: 2022-12-06 | Disposition: A | Discharge status: Home / Self Care | Payer: Medicare Other | Source: Ambulatory Visit | Attending: Pulmonary Disease | Admitting: Pulmonary Disease

## 2022-12-06 DIAGNOSIS — R918 Other nonspecific abnormal finding of lung field: Secondary | ICD-10-CM | POA: Insufficient documentation

## 2022-12-06 LAB — GLUCOSE, CAPILLARY: Glucose-Capillary: 97 mg/dL (ref 70–99)

## 2022-12-06 MED ORDER — FLUDEOXYGLUCOSE F - 18 (FDG) INJECTION
11.5000 | Freq: Once | INTRAVENOUS | Status: AC
  Administered 2022-12-06: 11.23 via INTRAVENOUS

## 2022-12-14 ENCOUNTER — Ambulatory Visit (INDEPENDENT_AMBULATORY_CARE_PROVIDER_SITE_OTHER): Payer: Medicare Other | Admitting: Acute Care

## 2022-12-14 ENCOUNTER — Encounter: Payer: Self-pay | Admitting: Acute Care

## 2022-12-14 ENCOUNTER — Encounter: Payer: Self-pay | Admitting: Pulmonary Disease

## 2022-12-14 VITALS — BP 138/60 | HR 70 | Temp 97.8°F | Ht 72.0 in | Wt 233.0 lb

## 2022-12-14 DIAGNOSIS — J439 Emphysema, unspecified: Secondary | ICD-10-CM

## 2022-12-14 DIAGNOSIS — Z87891 Personal history of nicotine dependence: Secondary | ICD-10-CM | POA: Diagnosis not present

## 2022-12-14 DIAGNOSIS — R911 Solitary pulmonary nodule: Secondary | ICD-10-CM | POA: Diagnosis not present

## 2022-12-14 NOTE — Progress Notes (Signed)
History of Present Illness Cody Michael is a 77 y.o. male former smoker ( Quit 1985 with a 25 pack year smoking history) referred to see Dr. Thora Lance 03/2022 for a RML nodule discovered incidentally on cardiac CT 02/25/21 .  Synopsis Cody Michael is a 77 y.o. male former smoker ( Quit 1985 with a 25 pack year smoking history) referred to see Dr. Thora Lance 03/2022 for a RML nodule discovered incidentally on cardiac CT 02/25/21 .Plan after the 03/2022 visit with Dr. Thora Lance was for a 6 month follow up Ct Chest done 09/2022.. This CT showed Left lower lobe nodule demonstrates a suspicious interval change with increase in the solid component. Plan was for a PET scan to better evaluate the changes noted on the 6 month follow up scan. Pt. Is here today to review PET results.    12/14/2022 Discussed the use of AI scribe software for clinical note transcription with the patient, who gave verbal consent to proceed.  History of Present Illness   The patient was referred to Dr. Thora Lance in February 2024 due to an incidental nodule found on a cardiac CT. After a six-month follow-up, it was observed that the left lower lobe nodule had an increase in the solid component. A PET scan was conducted, which did not show that the solid part was hypermetabolic. However, the 4 mm solid component is below PET avidity, and remains concerning for a slow growing adenocarcinoma. We discussed the option of continued imaging surveillance , vs a more aggressive approach which would include biopsy now. The patient expressed concern about this finding and opted for a more aggressive approach . We discussed biopsy vs referral to thoracic surgery for surgical evaluation for resection. Patient would like referral to thoracic surgery.  The patient has a history of smoking but quit in 1985. He has a 25 pack year smoking history.The patient does not have any breathing issues and does not use any breathing medicine, despite the CT showing some emphysema.  The patient maintains an active lifestyle, using a treadmill for 30 minutes every night.      Test Results: # Upper lobe predominant centrilobular emphysema Though significant radiographic extent he seems pretty much asymptomatic in this regard  03/2022 # 9mm RLL groundglass nodule # Multiple pulmonary nodules Cancer risk 7-13% over next 2-4 years by the calculators.   CT Chest 09/2022 Extensive emphysematous and bullous changes. Nodular 9 mm pleural thickening along the minor fissure on the right is stable finding. Pleural-based 4 mm nodule left upper lobe a stable finding. No pneumothorax or pleural effusion.   The left lower lobe superior segment nodular 1 cm lesion has changed compared to the prior study now demonstrating increase in size of the solid component of the lesion. PET scan is recommended to evaluate for hypermetabolic neoplasm, unless tissue diagnosis is obtained.  Left lower lobe nodule demonstrates a suspicious interval change with increase in the solid component. PET scan correlation recommended unless tissue diagnosis is obtained. 2. Stable nodular lesions elsewhere in both lungs. 3. Extensive emphysematous and bullous scarring.  PET scan 12/06/2022 The part solid nodule in the superior segment left upper lobe has no significant abnormal accentuated metabolic activity. That said, I concur with findings on 09/13/2022 that the previously purely ground-glass nodule demonstrated a new 4 mm solid component, which raises moderate suspicion for low-grade adenocarcinoma. In this scenario I would suggest either follow up chest CT in 6 months time, or if more aggressive approach is desired, biopsy or resection  might be considered. 2. No accentuated metabolic activity in the vicinity of the pleural-based nodularity along the minor fissure. 3. Emphysema. 4. Coronary, aortic arch, and branch vessel atherosclerotic vascular disease. 5. Mild prostatomegaly. 6. Bridging  spurring of both sacroiliac joints. Degenerative hip arthropathy, left greater than right.   Aortic Atherosclerosis (ICD10-I70.0) and Emphysema (ICD10-J43.9).        No data to display              No data to display          BNP No results found for: "BNP"  ProBNP No results found for: "PROBNP"  PFT No results found for: "FEV1PRE", "FEV1POST", "FVCPRE", "FVCPOST", "TLC", "DLCOUNC", "PREFEV1FVCRT", "PSTFEV1FVCRT"  NM PET Image Initial (PI) Skull Base To Thigh  Result Date: 12/14/2022 CLINICAL DATA:  Initial treatment strategy for lung nodule. EXAM: NUCLEAR MEDICINE PET SKULL BASE TO THIGH TECHNIQUE: 11.2 mCi F-18 FDG was injected intravenously. Full-ring PET imaging was performed from the skull base to thigh after the radiotracer. CT data was obtained and used for attenuation correction and anatomic localization. Fasting blood glucose: 97 mg/dl COMPARISON:  78/29/5621 FINDINGS: Mediastinal blood pool activity: SUV max 2.2 Liver activity: SUV max NA NECK: No significant abnormal hypermetabolic activity in this region. Incidental CT findings: None. CHEST: The part solid nodule in the superior segment left upper lobe has no significant abnormal accentuated metabolic activity, maximum SUV in this vicinity is 0.9 which is similar to the normal contralateral side. No accentuated metabolic activity in the vicinity of the pleural-based nodularity along the minor fissure. Incidental CT findings: Emphysema. Coronary, aortic arch, and branch vessel atherosclerotic vascular disease. ABDOMEN/PELVIS: No significant abnormal hypermetabolic activity in this region. Incidental CT findings: Atherosclerosis is present, including aortoiliac atherosclerotic disease. Mild prostatomegaly. SKELETON: No significant abnormal hypermetabolic activity in this region. Incidental CT findings: Bridging spurring of both sacroiliac joints. Degenerative hip arthropathy, left greater than right. IMPRESSION: 1. The part  solid nodule in the superior segment left upper lobe has no significant abnormal accentuated metabolic activity. That said, I concur with findings on 09/13/2022 that the previously purely ground-glass nodule demonstrated a new 4 mm solid component, which raises moderate suspicion for low-grade adenocarcinoma. In this scenario I would suggest either follow up chest CT in 6 months time, or if more aggressive approach is desired, biopsy or resection might be considered. 2. No accentuated metabolic activity in the vicinity of the pleural-based nodularity along the minor fissure. 3. Emphysema. 4. Coronary, aortic arch, and branch vessel atherosclerotic vascular disease. 5. Mild prostatomegaly. 6. Bridging spurring of both sacroiliac joints. Degenerative hip arthropathy, left greater than right. Aortic Atherosclerosis (ICD10-I70.0) and Emphysema (ICD10-J43.9). Electronically Signed   By: Gaylyn Rong M.D.   On: 12/14/2022 09:24     Past medical hx Past Medical History:  Diagnosis Date   Allergy    seasonal   Cataract    Hyperlipidemia    Sleep apnea    cpap     Social History   Tobacco Use   Smoking status: Former    Current packs/day: 0.00    Types: Cigarettes    Quit date: 06/29/1983    Years since quitting: 39.4   Smokeless tobacco: Never  Vaping Use   Vaping status: Never Used  Substance Use Topics   Alcohol use: No   Drug use: No    Mr.Vonbehren reports that he quit smoking about 39 years ago. His smoking use included cigarettes. He has never used smokeless tobacco. He reports that  he does not drink alcohol and does not use drugs.  Tobacco Cessation: Former smoker with a 25 pack year smoking history   Past surgical hx, Family hx, Social hx all reviewed.  Current Outpatient Medications on File Prior to Visit  Medication Sig   aspirin 81 MG tablet Take 81 mg by mouth daily.   atorvastatin (LIPITOR) 20 MG tablet Take 20 mg by mouth daily.   BEE POLLEN PO Take by mouth daily.    cholecalciferol (VITAMIN D) 1000 UNITS tablet Take 5,000 Units by mouth daily.    Multiple Vitamin (MULTIVITAMIN) tablet Take 1 tablet by mouth daily.   Multiple Vitamins-Minerals (OCUVITE ADULT 50+ PO) Take by mouth.   Omega-3 Fatty Acids (FISH OIL ODOR-LESS PO) Take by mouth. Take 2 pills daily   Semaglutide, 2 MG/DOSE, (OZEMPIC, 2 MG/DOSE,) 8 MG/3ML SOPN Inject 2 mg into the skin once a week.   OZEMPIC, 1 MG/DOSE, 4 MG/3ML SOPN Inject into the skin. (Patient not taking: Reported on 12/14/2022)   No current facility-administered medications on file prior to visit.     No Known Allergies  Review Of Systems:  Constitutional:   No  weight loss, night sweats,  Fevers, chills, fatigue, or  lassitude.  HEENT:   No headaches,  Difficulty swallowing,  Tooth/dental problems, or  Sore throat,                No sneezing, itching, ear ache, nasal congestion, post nasal drip,   CV:  No chest pain,  Orthopnea, PND, swelling in lower extremities, anasarca, dizziness, palpitations, syncope.   GI  No heartburn, indigestion, abdominal pain, nausea, vomiting, diarrhea, change in bowel habits, loss of appetite, bloody stools.   Resp: No shortness of breath with exertion or at rest.  No excess mucus, no productive cough,  No non-productive cough,  No coughing up of blood.  No change in color of mucus.  No wheezing.  No chest wall deformity  Skin: no rash or lesions.  GU: no dysuria, change in color of urine, no urgency or frequency.  No flank pain, no hematuria   MS:  No joint pain or swelling.  No decreased range of motion.  No back pain.  Psych:  No change in mood or affect. No depression or anxiety.  No memory loss.   Vital Signs BP 138/60 (BP Location: Right Arm, Cuff Size: Normal)   Pulse 70   Temp 97.8 F (36.6 C) (Temporal)   Ht 6' (1.829 m)   Wt 233 lb (105.7 kg)   SpO2 97%   BMI 31.60 kg/m    Physical Exam:  General- No distress,  A&Ox3, pleasant ENT: No sinus tenderness, TM  clear, pale nasal mucosa, no oral exudate,no post nasal drip, no LAN Cardiac: S1, S2, regular rate and rhythm, no murmur Chest: No wheeze/ rales/ dullness; no accessory muscle use, no nasal flaring, no sternal retractions Abd.: Soft Non-tender, ND, BS +, Body mass index is 31.6 kg/m.  Ext: No clubbing cyanosis, edema Neuro:  normal strength, MAE x 4, A&O x 3 Skin: No rashes, warm and dry, no lesions  Psych: normal mood and behavior       Assessment/Plan Slowly growing Left lower lobe nodule demonstrates a suspicious interval change with increase in the solid component to 4 mm  Former smoker quit 40 years ago Assessment and Plan  1. Lung nodule seen on imaging study - Ambulatory referral to Pulmonology  2. Former smoker    Lung Nodule Incidental finding  of a left lower lobe nodule with a new 4mm solid component on follow-up CT, raising moderate suspicion for a low-grade adenocarcinoma. PET scan did not show significant metabolic activity, but the nodule size may be below the detection threshold. Patient expresses concern and prefers a more aggressive approach. I have spoken with Dr. Tonia Brooms and we will refer to thoracic surgery to be evaluated for resection. He has PFT's ordered 01/18/24 that we will try to get moved forward.   Emphysema Noted on CT, but patient is asymptomatic and does not require any breathing medications. -No change in management.  General Health Maintenance -Continue Atorvastatin 40mg  for cholesterol management. -Pulmonary function tests scheduled for 01/18/2023. -Consultation with Dr. Tonia Brooms on 01/26/2023, which may not be needed if you have bronch with biopsies before.      I spent 25 minutes dedicated to the care of this patient on the date of this encounter to include pre-visit review of records, face-to-face time with the patient discussing conditions above, post visit ordering of testing, clinical documentation with the electronic health record, making  appropriate referrals as documented, and communicating necessary information to the patient's healthcare team.       Bevelyn Ngo, NP 12/14/2022  11:34 AM

## 2022-12-14 NOTE — Patient Instructions (Addendum)
It is good to see you today. While you PET scan did not show uptake in the solid portion of the pulmonary nodule, it is suspicious for a slow growing adenocarcinoma. Options are got continued imaging , vs more aggressive approach which is referral to thoracic surgery  now. I have placed the referral. You will get a call to get this scheduled. They will use the PFT's you have ordered to determine if you are a good surgical candidate.  Call if you have any questions. They will take great care of you.  Please contact office for sooner follow up if symptoms do not improve or worsen or seek emergency care

## 2022-12-17 ENCOUNTER — Telehealth: Payer: Self-pay | Admitting: *Deleted

## 2022-12-17 ENCOUNTER — Telehealth: Payer: Self-pay | Admitting: Acute Care

## 2022-12-17 NOTE — Telephone Encounter (Signed)
Patient has been made aware per Kandice Robinsons NP, see other phone note.

## 2022-12-17 NOTE — Telephone Encounter (Signed)
I have called the patient and his wife and discussed the fact that plan is to have the patient see thoracic surgery now versus doing a biopsy first.  They prefer an aggressive approach and are in agreement with this plan.  PFTs were not scheduled until January 18, 2023 and patient was not scheduled to see Dr. Dorris Fetch until January 25, 2023.  I have called to get the pulmonary function test moved up to November 25 at 2:30 PM.  I have called the patient and I have made him aware of this change. I will call thoracic surgery and see if we can get the patient's consultation with Dr. Dorris Fetch moved forward as well. Patient and his wife are in agreement with the above plan they understand that the pulmonary function tests are November 25 at 230 and they know that we are waiting to try and get the patient's appointment with Dr. Dorris Fetch before. They had no questions at completion of the call.

## 2022-12-17 NOTE — Telephone Encounter (Signed)
This was a secure chat that I got from Cody Michael I never called him to tell him anything.

## 2022-12-17 NOTE — Telephone Encounter (Signed)
See PT's [facility] Msg: procedure was scheduled for Tuesday November 12 for biopsy of nodule, which was later in the day changed to surgery to remove the nodule. Is the appointment for 12-21-22 for he surgery or a later date,   I sent a note to Ms. Alexandria Lodge asking her to advise PT due to time constraints.

## 2022-12-17 NOTE — Telephone Encounter (Signed)
Can one of you review this and advise the patient about the bronch?  I think it was supposed to cancelled but the patient is asking.

## 2022-12-21 ENCOUNTER — Encounter (HOSPITAL_COMMUNITY): Admission: RE | Payer: Self-pay | Source: Home / Self Care

## 2022-12-21 ENCOUNTER — Ambulatory Visit (HOSPITAL_COMMUNITY): Admission: RE | Admit: 2022-12-21 | Payer: Medicare Other | Source: Home / Self Care | Admitting: Pulmonary Disease

## 2022-12-21 SURGERY — BRONCHOSCOPY, WITH BIOPSY USING ELECTROMAGNETIC NAVIGATION
Anesthesia: General | Laterality: Left

## 2022-12-28 ENCOUNTER — Ambulatory Visit: Payer: Medicare Other | Admitting: Acute Care

## 2022-12-29 ENCOUNTER — Encounter: Payer: Medicare Other | Admitting: Thoracic Surgery (Cardiothoracic Vascular Surgery)

## 2023-01-03 ENCOUNTER — Encounter: Payer: Medicare Other | Admitting: Thoracic Surgery (Cardiothoracic Vascular Surgery)

## 2023-01-03 ENCOUNTER — Ambulatory Visit (HOSPITAL_BASED_OUTPATIENT_CLINIC_OR_DEPARTMENT_OTHER): Payer: Medicare Other | Admitting: Pulmonary Disease

## 2023-01-03 DIAGNOSIS — R918 Other nonspecific abnormal finding of lung field: Secondary | ICD-10-CM

## 2023-01-03 LAB — PULMONARY FUNCTION TEST
DL/VA % pred: 99 %
DL/VA: 3.87 ml/min/mmHg/L
DLCO cor % pred: 95 %
DLCO cor: 24.3 ml/min/mmHg
DLCO unc % pred: 95 %
DLCO unc: 24.3 ml/min/mmHg
FEF 25-75 Post: 2.09 L/s
FEF 25-75 Pre: 1.79 L/s
FEF2575-%Change-Post: 16 %
FEF2575-%Pred-Post: 96 %
FEF2575-%Pred-Pre: 82 %
FEV1-%Change-Post: 3 %
FEV1-%Pred-Post: 100 %
FEV1-%Pred-Pre: 97 %
FEV1-Post: 3.08 L
FEV1-Pre: 2.99 L
FEV1FVC-%Change-Post: 2 %
FEV1FVC-%Pred-Pre: 97 %
FEV6-%Change-Post: 1 %
FEV6-%Pred-Post: 105 %
FEV6-%Pred-Pre: 104 %
FEV6-Post: 4.25 L
FEV6-Pre: 4.2 L
FEV6FVC-%Change-Post: 0 %
FEV6FVC-%Pred-Post: 105 %
FEV6FVC-%Pred-Pre: 104 %
FVC-%Change-Post: 0 %
FVC-%Pred-Post: 100 %
FVC-%Pred-Pre: 99 %
FVC-Post: 4.31 L
FVC-Pre: 4.28 L
Post FEV1/FVC ratio: 72 %
Post FEV6/FVC ratio: 99 %
Pre FEV1/FVC ratio: 70 %
Pre FEV6/FVC Ratio: 98 %
RV % pred: 117 %
RV: 3.14 L
TLC % pred: 102 %
TLC: 7.44 L

## 2023-01-03 NOTE — Patient Instructions (Signed)
Full PFT Performed Today  

## 2023-01-03 NOTE — Progress Notes (Signed)
Full PFT Performed Today  

## 2023-01-05 ENCOUNTER — Institutional Professional Consult (permissible substitution): Payer: Medicare Other | Admitting: Thoracic Surgery (Cardiothoracic Vascular Surgery)

## 2023-01-05 ENCOUNTER — Encounter: Payer: Self-pay | Admitting: Thoracic Surgery (Cardiothoracic Vascular Surgery)

## 2023-01-05 ENCOUNTER — Other Ambulatory Visit: Payer: Self-pay | Admitting: *Deleted

## 2023-01-05 ENCOUNTER — Encounter: Payer: Self-pay | Admitting: *Deleted

## 2023-01-05 VITALS — BP 132/73 | HR 72 | Resp 20 | Ht 72.0 in | Wt 235.0 lb

## 2023-01-05 DIAGNOSIS — I2510 Atherosclerotic heart disease of native coronary artery without angina pectoris: Secondary | ICD-10-CM

## 2023-01-05 DIAGNOSIS — R911 Solitary pulmonary nodule: Secondary | ICD-10-CM

## 2023-01-05 DIAGNOSIS — I700 Atherosclerosis of aorta: Secondary | ICD-10-CM | POA: Insufficient documentation

## 2023-01-05 DIAGNOSIS — R918 Other nonspecific abnormal finding of lung field: Secondary | ICD-10-CM | POA: Insufficient documentation

## 2023-01-05 DIAGNOSIS — J439 Emphysema, unspecified: Secondary | ICD-10-CM | POA: Insufficient documentation

## 2023-01-05 HISTORY — DX: Atherosclerotic heart disease of native coronary artery without angina pectoris: I25.10

## 2023-01-05 NOTE — Progress Notes (Signed)
PCP is Merri Brunette, MD Referring Provider is Bevelyn Ngo, NP  Chief Complaint  Patient presents with   Lung Lesion    Surgical consult    HPI: [name] sent for consultation regarding a lung nodule.  Cody Michael is a 77 year old gentleman with a history of hyperlipidemia, coronary calcification by CT, sleep apnea, and lung nodules.  He smoked about a pack a day for 25 years prior to quitting 30 years ago.  He had a CT for coronary calcium screening in January 2023.  Noted to have right middle lobe lung nodule.  A follow-up CT at 1 year showed no evidence of progression but there was a groundglass opacity in the superior segment of the left lower lobe.  He had a follow-up CT of that in August which showed the nodule was about 1 cm in size which was unchanged but there was a new 4 mm solid component.  PET/CT showed no significant hypermetabolic activity.  There was no mediastinal or hilar adenopathy.  He feels well.  He is not having any chest pain, pressure, tightness, or shortness of breath.  He is very active working around his property which [place] acres.  Lost about 12 pounds with Ozempic but his appetite is good and his weight has been stable recently.  Zubrod Score: At the time of surgery this patient's most appropriate activity status/level should be described as: [x]     0    Normal activity, no symptoms []     1    Restricted in physical strenuous activity but ambulatory, able to do out light work []     2    Ambulatory and capable of self care, unable to do work activities, up and about >50 % of waking hours                              []     3    Only limited self care, in bed greater than 50% of waking hours []     4    Completely disabled, no self care, confined to bed or chair []     5    Moribund  Past Medical History:  Diagnosis Date   Allergy    seasonal   Cataract    Coronary artery calcification seen on CAT scan 01/05/2023   Hyperlipidemia    Sleep apnea     cpap    Past Surgical History:  Procedure Laterality Date   COLONOSCOPY  10/10/2018   [facility]   INGUINAL HERNIA REPAIR     1983 left; 1993 right   POLYPECTOMY      Family History  Problem Relation Age of Onset   Heart failure Mother    Lung cancer Father    Other Sister    Heart disease Brother    Other Brother    Colon cancer Neg Hx    Colitis Neg Hx    Esophageal cancer Neg Hx    Rectal cancer Neg Hx    Stomach cancer Neg Hx     Social History Social History   Tobacco Use   Smoking status: Former    Current packs/day: 1.00    Average packs/day: 1 pack/day for 25.1 years (25.1 ttl pk-yrs)    Types: Cigarettes    Start date: 12/13/1997    Quit date: 06/29/1983   Smokeless tobacco: Never   Tobacco comments:    Quit 1985  Vaping Use  Vaping status: Never Used  Substance Use Topics   Alcohol use: No   Drug use: No    Current Outpatient Medications  Medication Sig Dispense Refill   aspirin 81 MG tablet Take 81 mg by mouth daily.     atorvastatin (LIPITOR) 20 MG tablet Take 20 mg by mouth daily.     BEE POLLEN PO Take by mouth daily.     cholecalciferol (VITAMIN D) 1000 UNITS tablet Take 5,000 Units by mouth daily.      Multiple Vitamin (MULTIVITAMIN) tablet Take 1 tablet by mouth daily.     Multiple Vitamins-Minerals (OCUVITE ADULT 50+ PO) Take by mouth.     Omega-3 Fatty Acids (FISH OIL ODOR-LESS PO) Take by mouth. Take 2 pills daily     OZEMPIC, 1 MG/DOSE, 4 MG/3ML SOPN Inject into the skin. (Patient not taking: Reported on 12/14/2022)     Semaglutide, 2 MG/DOSE, (OZEMPIC, 2 MG/DOSE,) 8 MG/3ML SOPN Inject 2 mg into the skin once a week.     No current facility-administered medications for this visit.    No Known Allergies  Review of Systems  Constitutional:  Negative for activity change, fatigue and unexpected weight change.  HENT:  Negative for trouble swallowing and voice change.   Eyes:  Negative for visual disturbance.  Respiratory:  Positive for  apnea (CPAP at night). Negative for cough, shortness of breath and wheezing.   Cardiovascular:  Negative for chest pain and leg swelling.  Genitourinary:  Negative for difficulty urinating.  Neurological:  Negative for seizures and weakness.  Hematological:  Negative for adenopathy. Does not bruise/bleed easily.  All other systems reviewed and are negative.   BP 132/73   Pulse 72   Resp 20   Ht 6' (1.829 m)   Wt 235 lb (106.6 kg)   SpO2 94% Comment: RA  BMI 31.87 kg/m  Physical Exam Vitals reviewed.  Constitutional:      General: He is not in acute distress.    Appearance: Normal appearance.  HENT:     Head: Normocephalic and atraumatic.  Eyes:     General: No scleral icterus.    Extraocular Movements: Extraocular movements intact.  Neck:     Vascular: No carotid bruit.  Cardiovascular:     Rate and Rhythm: Normal rate and regular rhythm.     Heart sounds: Normal heart sounds. No murmur heard.    No friction rub. No gallop.  Pulmonary:     Effort: Pulmonary effort is normal. No respiratory distress.     Breath sounds: Normal breath sounds. No wheezing or rales.  Abdominal:     General: There is no distension.     Palpations: Abdomen is soft.  Lymphadenopathy:     Cervical: No cervical adenopathy.  Skin:    General: Skin is warm and dry.  Neurological:     General: No focal deficit present.     Mental Status: He is alert and oriented to person, place, and time.     Cranial Nerves: No cranial nerve deficit.     Motor: No weakness.     Diagnostic Tests: CT CHEST WITHOUT CONTRAST   TECHNIQUE: Multidetector CT imaging of the chest was performed following the standard protocol without IV contrast.   RADIATION DOSE REDUCTION: This exam was performed according to the departmental dose-optimization program which includes automated exposure control, adjustment of the mA and/or kV according to patient size and/or use of iterative reconstruction technique.    COMPARISON:  03/01/2022.   FINDINGS:  Cardiovascular: Atheromatous calcifications of aorta and coronary arteries. No pericardial effusion. No cardiomegaly.   Mediastinum/Nodes: Choose 1   Lungs/Pleura: Extensive emphysematous and bullous changes. Nodular 9 mm pleural thickening along the minor fissure on the right is stable finding. Pleural-based 4 mm nodule left upper lobe a stable finding. No pneumothorax or pleural effusion.   The left lower lobe superior segment nodular 1 cm lesion has changed compared to the prior study now demonstrating increase in size of the solid component of the lesion. PET scan is recommended to evaluate for hypermetabolic neoplasm, unless tissue diagnosis is obtained.   Upper Abdomen: No acute abnormality.  No adrenal lesions.   Musculoskeletal: Osteopenia.  Thoracic degenerative changes.   IMPRESSION: 1. Left lower lobe nodule demonstrates a suspicious interval change with increase in the solid component. PET scan correlation recommended unless tissue diagnosis is obtained. 2. Stable nodular lesions elsewhere in both lungs. 3. Extensive emphysematous and bullous scarring.     Electronically Signed   By: Layla Maw M.D.   On: 09/17/2022 09:19 NUCLEAR MEDICINE PET SKULL BASE TO THIGH   TECHNIQUE: 11.2 mCi F-18 FDG was injected intravenously. Full-ring PET imaging was performed from the skull base to thigh after the radiotracer. CT data was obtained and used for attenuation correction and anatomic localization.   Fasting blood glucose: 97 mg/dl   COMPARISON:  40/98/1191   FINDINGS: Mediastinal blood pool activity: SUV max 2.2   Liver activity: SUV max NA   NECK: No significant abnormal hypermetabolic activity in this region.   Incidental CT findings: None.   CHEST: The part solid nodule in the superior segment left upper lobe has no significant abnormal accentuated metabolic activity, maximum SUV in this vicinity is 0.9 which  is similar to the normal contralateral side.   No accentuated metabolic activity in the vicinity of the pleural-based nodularity along the minor fissure.   Incidental CT findings: Emphysema. Coronary, aortic arch, and branch vessel atherosclerotic vascular disease.   ABDOMEN/PELVIS: No significant abnormal hypermetabolic activity in this region.   Incidental CT findings: Atherosclerosis is present, including aortoiliac atherosclerotic disease. Mild prostatomegaly.   SKELETON: No significant abnormal hypermetabolic activity in this region.   Incidental CT findings: Bridging spurring of both sacroiliac joints. Degenerative hip arthropathy, left greater than right.   IMPRESSION: 1. The part solid nodule in the superior segment left upper lobe has no significant abnormal accentuated metabolic activity. That said, I concur with findings on 09/13/2022 that the previously purely ground-glass nodule demonstrated a new 4 mm solid component, which raises moderate suspicion for low-grade adenocarcinoma. In this scenario I would suggest either follow up chest CT in 6 months time, or if more aggressive approach is desired, biopsy or resection might be considered. 2. No accentuated metabolic activity in the vicinity of the pleural-based nodularity along the minor fissure. 3. Emphysema. 4. Coronary, aortic arch, and branch vessel atherosclerotic vascular disease. 5. Mild prostatomegaly. 6. Bridging spurring of both sacroiliac joints. Degenerative hip arthropathy, left greater than right.   Aortic Atherosclerosis (ICD10-I70.0) and Emphysema (ICD10-J43.9).     Electronically Signed   By: Gaylyn Rong M.D.   On: 12/14/2022 09:24  I personally reviewed the CT and PET/CT images.  There is a 1 cm groundglass opacity in the superior segment of the left lower lobe with a 4 mm solid component.  Stable size but progression of solid component between January and August CT scans.  Coronary  and aortic atherosclerosis.  Upper lobe predominant centrilobular emphysema right  greater than left.  Pulmonary function testing 01/03/2023 FVC 4.28 (99%)  FEV1 2.99 (97%) No change with bronchodilator DLCO 24.30 (95%)  Impression: Cody Michael is a 77 year old gentleman with a history of hyperlipidemia, coronary calcification by CT, sleep apnea, and lung nodules.  Right middle lobe nodule initially noted on a CT for coronary calcium scoring.  CT of the chest showed a groundglass opacity in the superior segment of the left lower lobe.  Follow-up CT showed development of a solid component.  Left lower lobe lung nodule-mixed density nodule with interval development of a solid component.  Highly suspicious for a low-grade adenocarcinoma.  Options include radiographic follow-up, bronchoscopic biopsy, and surgical resection.  No reason to continue radiographic follow-up visit has progressed.  Positive bronchoscopic biopsy with indicated surgery but a negative and would not rule out the possibility of cancer.  Given that he is a good surgical candidate, the best option is to proceed with surgical resection for definitive diagnosis and treatment at the same setting.  Right middle lobe nodule-flat nodule along the fissure consistent with a parenchymal node.  Coronary calcification-moderate.  No anginal symptoms.  He is on a statin.  Emphysema-has severe emphysema of the right upper lobe and moderate emphysema of the left upper lobe.  Asymptomatic.  We discussed the option of proceeding with robotic assisted left upper lobe superior segmentectomy.  I informed Mr. and Mrs. Olund of the general nature of the procedure including the need for general anesthesia, the incisions to be used, the use of the surgical robot, the use of a drainage tube postoperatively, the expected hospital stay, and the overall recovery.  I informed them of the indications, risk, benefits, and alternatives.  They understand the  risks include, but not limited to death, MI, DVT, PE, bleeding, possible need for transfusion, infection, prolonged air leak, cardiac arrhythmias, chronic pain, as well as the possibility of other unforeseeable complications.  He accepts the risk and wishes to proceed.  Plan: Robotic assisted left lower lobe superior segmentectomy on Monday, 01/24/2023  Loreli Slot, MD [facility] [phone]

## 2023-01-05 NOTE — H&P (View-Only) (Signed)
 PCP is Merri Brunette, MD Referring Provider is Bevelyn Ngo, NP  Chief Complaint  Patient presents with   Lung Lesion    Surgical consult    HPI: Cody Michael sent for consultation regarding a lung nodule.  Cody Michael is a 77 year old gentleman with a history of hyperlipidemia, coronary calcification by CT, sleep apnea, and lung nodules.  He smoked about a pack a day for 25 years prior to quitting 30 years ago.  He had a CT for coronary calcium screening in January 2023.  Noted to have right middle lobe lung nodule.  A follow-up CT at 1 year showed no evidence of progression but there was a groundglass opacity in the superior segment of the left lower lobe.  He had a follow-up CT of that in August which showed the nodule was about 1 cm in size which was unchanged but there was a new 4 mm solid component.  PET/CT showed no significant hypermetabolic activity.  There was no mediastinal or hilar adenopathy.  He feels well.  He is not having any chest pain, pressure, tightness, or shortness of breath.  He is very active working around his property which [place] acres.  Lost about 12 pounds with Ozempic but his appetite is good and his weight has been stable recently.  Zubrod Score: At the time of surgery this patient's most appropriate activity status/level should be described as: [x]     0    Normal activity, no symptoms []     1    Restricted in physical strenuous activity but ambulatory, able to do out light work []     2    Ambulatory and capable of self care, unable to do work activities, up and about >50 % of waking hours                              []     3    Only limited self care, in bed greater than 50% of waking hours []     4    Completely disabled, no self care, confined to bed or chair []     5    Moribund  Past Medical History:  Diagnosis Date   Allergy    seasonal   Cataract    Coronary artery calcification seen on CAT scan 01/05/2023   Hyperlipidemia    Sleep apnea     cpap    Past Surgical History:  Procedure Laterality Date   COLONOSCOPY  10/10/2018   [facility]   INGUINAL HERNIA REPAIR     1983 left; 1993 right   POLYPECTOMY      Family History  Problem Relation Age of Onset   Heart failure Mother    Lung cancer Father    Other Sister    Heart disease Brother    Other Brother    Colon cancer Neg Hx    Colitis Neg Hx    Esophageal cancer Neg Hx    Rectal cancer Neg Hx    Stomach cancer Neg Hx     Social History Social History   Tobacco Use   Smoking status: Former    Current packs/day: 1.00    Average packs/day: 1 pack/day for 25.1 years (25.1 ttl pk-yrs)    Types: Cigarettes    Start date: 12/13/1997    Quit date: 06/29/1983   Smokeless tobacco: Never   Tobacco comments:    Quit 1985  Vaping Use  Vaping status: Never Used  Substance Use Topics   Alcohol use: No   Drug use: No    Current Outpatient Medications  Medication Sig Dispense Refill   aspirin 81 MG tablet Take 81 mg by mouth daily.     atorvastatin (LIPITOR) 20 MG tablet Take 20 mg by mouth daily.     BEE POLLEN PO Take by mouth daily.     cholecalciferol (VITAMIN D) 1000 UNITS tablet Take 5,000 Units by mouth daily.      Multiple Vitamin (MULTIVITAMIN) tablet Take 1 tablet by mouth daily.     Multiple Vitamins-Minerals (OCUVITE ADULT 50+ PO) Take by mouth.     Omega-3 Fatty Acids (FISH OIL ODOR-LESS PO) Take by mouth. Take 2 pills daily     OZEMPIC, 1 MG/DOSE, 4 MG/3ML SOPN Inject into the skin. (Patient not taking: Reported on 12/14/2022)     Semaglutide, 2 MG/DOSE, (OZEMPIC, 2 MG/DOSE,) 8 MG/3ML SOPN Inject 2 mg into the skin once a week.     No current facility-administered medications for this visit.    No Known Allergies  Review of Systems  Constitutional:  Negative for activity change, fatigue and unexpected weight change.  HENT:  Negative for trouble swallowing and voice change.   Eyes:  Negative for visual disturbance.  Respiratory:  Positive for  apnea (CPAP at night). Negative for cough, shortness of breath and wheezing.   Cardiovascular:  Negative for chest pain and leg swelling.  Genitourinary:  Negative for difficulty urinating.  Neurological:  Negative for seizures and weakness.  Hematological:  Negative for adenopathy. Does not bruise/bleed easily.  All other systems reviewed and are negative.   BP 132/73   Pulse 72   Resp 20   Ht 6' (1.829 m)   Wt 235 lb (106.6 kg)   SpO2 94% Comment: RA  BMI 31.87 kg/m  Physical Exam Vitals reviewed.  Constitutional:      General: He is not in acute distress.    Appearance: Normal appearance.  HENT:     Head: Normocephalic and atraumatic.  Eyes:     General: No scleral icterus.    Extraocular Movements: Extraocular movements intact.  Neck:     Vascular: No carotid bruit.  Cardiovascular:     Rate and Rhythm: Normal rate and regular rhythm.     Heart sounds: Normal heart sounds. No murmur heard.    No friction rub. No gallop.  Pulmonary:     Effort: Pulmonary effort is normal. No respiratory distress.     Breath sounds: Normal breath sounds. No wheezing or rales.  Abdominal:     General: There is no distension.     Palpations: Abdomen is soft.  Lymphadenopathy:     Cervical: No cervical adenopathy.  Skin:    General: Skin is warm and dry.  Neurological:     General: No focal deficit present.     Mental Status: He is alert and oriented to person, place, and time.     Cranial Nerves: No cranial nerve deficit.     Motor: No weakness.     Diagnostic Tests: CT CHEST WITHOUT CONTRAST   TECHNIQUE: Multidetector CT imaging of the chest was performed following the standard protocol without IV contrast.   RADIATION DOSE REDUCTION: This exam was performed according to the departmental dose-optimization program which includes automated exposure control, adjustment of the mA and/or kV according to patient size and/or use of iterative reconstruction technique.    COMPARISON:  03/01/2022.   FINDINGS:  Cardiovascular: Atheromatous calcifications of aorta and coronary arteries. No pericardial effusion. No cardiomegaly.   Mediastinum/Nodes: Choose 1   Lungs/Pleura: Extensive emphysematous and bullous changes. Nodular 9 mm pleural thickening along the minor fissure on the right is stable finding. Pleural-based 4 mm nodule left upper lobe a stable finding. No pneumothorax or pleural effusion.   The left lower lobe superior segment nodular 1 cm lesion has changed compared to the prior study now demonstrating increase in size of the solid component of the lesion. PET scan is recommended to evaluate for hypermetabolic neoplasm, unless tissue diagnosis is obtained.   Upper Abdomen: No acute abnormality.  No adrenal lesions.   Musculoskeletal: Osteopenia.  Thoracic degenerative changes.   IMPRESSION: 1. Left lower lobe nodule demonstrates a suspicious interval change with increase in the solid component. PET scan correlation recommended unless tissue diagnosis is obtained. 2. Stable nodular lesions elsewhere in both lungs. 3. Extensive emphysematous and bullous scarring.     Electronically Signed   By: Layla Maw M.D.   On: 09/17/2022 09:19 NUCLEAR MEDICINE PET SKULL BASE TO THIGH   TECHNIQUE: 11.2 mCi F-18 FDG was injected intravenously. Full-ring PET imaging was performed from the skull base to thigh after the radiotracer. CT data was obtained and used for attenuation correction and anatomic localization.   Fasting blood glucose: 97 mg/dl   COMPARISON:  40/98/1191   FINDINGS: Mediastinal blood pool activity: SUV max 2.2   Liver activity: SUV max NA   NECK: No significant abnormal hypermetabolic activity in this region.   Incidental CT findings: None.   CHEST: The part solid nodule in the superior segment left upper lobe has no significant abnormal accentuated metabolic activity, maximum SUV in this vicinity is 0.9 which  is similar to the normal contralateral side.   No accentuated metabolic activity in the vicinity of the pleural-based nodularity along the minor fissure.   Incidental CT findings: Emphysema. Coronary, aortic arch, and branch vessel atherosclerotic vascular disease.   ABDOMEN/PELVIS: No significant abnormal hypermetabolic activity in this region.   Incidental CT findings: Atherosclerosis is present, including aortoiliac atherosclerotic disease. Mild prostatomegaly.   SKELETON: No significant abnormal hypermetabolic activity in this region.   Incidental CT findings: Bridging spurring of both sacroiliac joints. Degenerative hip arthropathy, left greater than right.   IMPRESSION: 1. The part solid nodule in the superior segment left upper lobe has no significant abnormal accentuated metabolic activity. That said, I concur with findings on 09/13/2022 that the previously purely ground-glass nodule demonstrated a new 4 mm solid component, which raises moderate suspicion for low-grade adenocarcinoma. In this scenario I would suggest either follow up chest CT in 6 months time, or if more aggressive approach is desired, biopsy or resection might be considered. 2. No accentuated metabolic activity in the vicinity of the pleural-based nodularity along the minor fissure. 3. Emphysema. 4. Coronary, aortic arch, and branch vessel atherosclerotic vascular disease. 5. Mild prostatomegaly. 6. Bridging spurring of both sacroiliac joints. Degenerative hip arthropathy, left greater than right.   Aortic Atherosclerosis (ICD10-I70.0) and Emphysema (ICD10-J43.9).     Electronically Signed   By: Gaylyn Rong M.D.   On: 12/14/2022 09:24  I personally reviewed the CT and PET/CT images.  There is a 1 cm groundglass opacity in the superior segment of the left lower lobe with a 4 mm solid component.  Stable size but progression of solid component between January and August CT scans.  Coronary  and aortic atherosclerosis.  Upper lobe predominant centrilobular emphysema right  greater than left.  Pulmonary function testing 01/03/2023 FVC 4.28 (99%)  FEV1 2.99 (97%) No change with bronchodilator DLCO 24.30 (95%)  Impression: Cody Michael is a 77 year old gentleman with a history of hyperlipidemia, coronary calcification by CT, sleep apnea, and lung nodules.  Right middle lobe nodule initially noted on a CT for coronary calcium scoring.  CT of the chest showed a groundglass opacity in the superior segment of the left lower lobe.  Follow-up CT showed development of a solid component.  Left lower lobe lung nodule-mixed density nodule with interval development of a solid component.  Highly suspicious for a low-grade adenocarcinoma.  Options include radiographic follow-up, bronchoscopic biopsy, and surgical resection.  No reason to continue radiographic follow-up visit has progressed.  Positive bronchoscopic biopsy with indicated surgery but a negative and would not rule out the possibility of cancer.  Given that he is a good surgical candidate, the best option is to proceed with surgical resection for definitive diagnosis and treatment at the same setting.  Right middle lobe nodule-flat nodule along the fissure consistent with a parenchymal node.  Coronary calcification-moderate.  No anginal symptoms.  He is on a statin.  Emphysema-has severe emphysema of the right upper lobe and moderate emphysema of the left upper lobe.  Asymptomatic.  We discussed the option of proceeding with robotic assisted left upper lobe superior segmentectomy.  I informed Mr. and Mrs. Olund of the general nature of the procedure including the need for general anesthesia, the incisions to be used, the use of the surgical robot, the use of a drainage tube postoperatively, the expected hospital stay, and the overall recovery.  I informed them of the indications, risk, benefits, and alternatives.  They understand the  risks include, but not limited to death, MI, DVT, PE, bleeding, possible need for transfusion, infection, prolonged air leak, cardiac arrhythmias, chronic pain, as well as the possibility of other unforeseeable complications.  He accepts the risk and wishes to proceed.  Plan: Robotic assisted left lower lobe superior segmentectomy on Monday, 01/24/2023  Loreli Slot, MD [facility] [phone]

## 2023-01-18 ENCOUNTER — Encounter (HOSPITAL_BASED_OUTPATIENT_CLINIC_OR_DEPARTMENT_OTHER): Payer: Medicare Other

## 2023-01-19 NOTE — Pre-Procedure Instructions (Addendum)
Surgical Instructions   Your procedure is scheduled on January 24, 2023. Report to [facility] Main Entrance "A" at 10:10 A.M., then check in with the Admitting office. Any questions or running late day of surgery: call [phone]  Questions prior to your surgery date: call [phone], Monday-Friday, 8am-4pm. If you experience any cold or flu symptoms such as cough, fever, chills, shortness of breath, etc. between now and your scheduled surgery, please notify us at the above number.     Remember:  Do not eat or drink after midnight the night before your surgery    Take these medicines the morning of surgery with A SIP OF WATER: atorvastatin (LIPITOR)    STOP taking your Semaglutide (OZEMPIC) and Fish Oil one week prior to surgery. DO NOT take any doses after December 8th.  Continue taking your Aspirin through the day before surgery. DO NOT take any the morning of surgery.   One week prior to surgery, STOP taking any Aleve, Naproxen, Ibuprofen, Motrin, Advil, Goody's, BC's, all herbal medications and non-prescription vitamins.                     Do NOT Smoke (Tobacco/Vaping) for 24 hours prior to your procedure.  If you use a CPAP at night, you may bring your mask/headgear for your overnight stay.   You will be asked to remove any contacts, glasses, piercing's, hearing aid's, dentures/partials prior to surgery. Please bring cases for these items if needed.    Patients discharged the day of surgery will not be allowed to drive home, and someone needs to stay with them for 24 hours.  SURGICAL WAITING ROOM VISITATION Patients may have no more than 2 support people in the waiting area - these visitors may rotate.   Pre-op nurse will coordinate an appropriate time for 1 ADULT support person, who may not rotate, to accompany patient in pre-op.  Children under the age of 30 must have an adult with them who is not the patient and must remain in the main waiting area with an  adult.  If the patient needs to stay at the hospital during part of their recovery, the visitor guidelines for inpatient rooms apply.  Please refer to the [facility] website for the visitor guidelines for any additional information.   If you received a COVID test during your pre-op visit  it is requested that you wear a mask when out in public, stay away from anyone that may not be feeling well and notify your surgeon if you develop symptoms. If you have been in contact with anyone that has tested positive in the last 10 days please notify you surgeon.      Pre-operative CHG Bathing Instructions   You can play a key role in reducing the risk of infection after surgery. Your skin needs to be as free of germs as possible. You can reduce the number of germs on your skin by washing with CHG (chlorhexidine gluconate) soap before surgery. CHG is an antiseptic soap that kills germs and continues to kill germs even after washing.   DO NOT use if you have an allergy to chlorhexidine/CHG or antibacterial soaps. If your skin becomes reddened or irritated, stop using the CHG and notify one of our RNs at [phone].              TAKE A SHOWER THE NIGHT BEFORE SURGERY AND THE DAY OF SURGERY    Please keep in mind the following:  DO NOT  shave, including legs and underarms, 48 hours prior to surgery.   You may shave your face before/day of surgery.  Place clean sheets on your bed the night before surgery Use a clean washcloth (not used since being washed) for each shower. DO NOT sleep with pet's night before surgery.  CHG Shower Instructions:  Wash your face and private area with normal soap. If you choose to wash your hair, wash first with your normal shampoo.  After you use shampoo/soap, rinse your hair and body thoroughly to remove shampoo/soap residue.  Turn the water OFF and apply half the bottle of CHG soap to a CLEAN washcloth.  Apply CHG soap ONLY FROM YOUR NECK DOWN TO YOUR TOES (washing  for 3-5 minutes)  DO NOT use CHG soap on face, private areas, open wounds, or sores.  Pay special attention to the area where your surgery is being performed.  If you are having back surgery, having someone wash your back for you may be helpful. Wait 2 minutes after CHG soap is applied, then you may rinse off the CHG soap.  Pat dry with a clean towel  Put on clean pajamas    Additional instructions for the day of surgery: DO NOT APPLY any lotions, deodorants, cologne, or perfumes.   Do not wear jewelry or makeup Do not wear nail polish, gel polish, artificial nails, or any other type of covering on natural nails (fingers and toes) Do not bring valuables to the hospital. [facility] is not responsible for valuables/personal belongings. Put on clean/comfortable clothes.  Please brush your teeth.  Ask your nurse before applying any prescription medications to the skin.

## 2023-01-20 ENCOUNTER — Encounter (HOSPITAL_COMMUNITY)
Admission: RE | Admit: 2023-01-20 | Discharge: 2023-01-20 | Disposition: A | Discharge status: Home / Self Care | Payer: Medicare Other | Source: Ambulatory Visit | Attending: Thoracic Surgery (Cardiothoracic Vascular Surgery) | Admitting: Thoracic Surgery (Cardiothoracic Vascular Surgery)

## 2023-01-20 ENCOUNTER — Ambulatory Visit (HOSPITAL_COMMUNITY)
Admission: RE | Admit: 2023-01-20 | Discharge: 2023-01-20 | Disposition: A | Discharge status: Home / Self Care | Payer: Medicare Other | Source: Ambulatory Visit | Attending: Thoracic Surgery (Cardiothoracic Vascular Surgery) | Admitting: Thoracic Surgery (Cardiothoracic Vascular Surgery)

## 2023-01-20 ENCOUNTER — Encounter (HOSPITAL_COMMUNITY): Payer: Self-pay

## 2023-01-20 ENCOUNTER — Other Ambulatory Visit: Payer: Self-pay

## 2023-01-20 VITALS — BP 116/65 | HR 73 | Temp 97.7°F | Resp 20 | Ht 72.0 in | Wt 234.2 lb

## 2023-01-20 DIAGNOSIS — Z1152 Encounter for screening for COVID-19: Secondary | ICD-10-CM | POA: Insufficient documentation

## 2023-01-20 DIAGNOSIS — R911 Solitary pulmonary nodule: Secondary | ICD-10-CM | POA: Insufficient documentation

## 2023-01-20 DIAGNOSIS — J439 Emphysema, unspecified: Secondary | ICD-10-CM | POA: Insufficient documentation

## 2023-01-20 DIAGNOSIS — Z01818 Encounter for other preprocedural examination: Secondary | ICD-10-CM | POA: Insufficient documentation

## 2023-01-20 HISTORY — DX: Unspecified asthma, uncomplicated: J45.909

## 2023-01-20 LAB — CBC
HCT: 41.9 % (ref 39.0–52.0)
Hemoglobin: 14.4 g/dL (ref 13.0–17.0)
MCH: 31.6 pg (ref 26.0–34.0)
MCHC: 34.4 g/dL (ref 30.0–36.0)
MCV: 91.9 fL (ref 80.0–100.0)
Platelets: 250 10*3/uL (ref 150–400)
RBC: 4.56 MIL/uL (ref 4.22–5.81)
RDW: 12.8 % (ref 11.5–15.5)
WBC: 6.3 10*3/uL (ref 4.0–10.5)
nRBC: 0 % (ref 0.0–0.2)

## 2023-01-20 LAB — SURGICAL PCR SCREEN
MRSA, PCR: NEGATIVE
Staphylococcus aureus: POSITIVE — AB

## 2023-01-20 LAB — URINALYSIS, ROUTINE W REFLEX MICROSCOPIC
Bilirubin Urine: NEGATIVE
Glucose, UA: NEGATIVE mg/dL
Hgb urine dipstick: NEGATIVE
Ketones, ur: NEGATIVE mg/dL
Leukocytes,Ua: NEGATIVE
Nitrite: NEGATIVE
Protein, ur: NEGATIVE mg/dL
Specific Gravity, Urine: 1.015 (ref 1.005–1.030)
pH: 5 (ref 5.0–8.0)

## 2023-01-20 LAB — PROTIME-INR
INR: 1.1 (ref 0.8–1.2)
Prothrombin Time: 14.2 s (ref 11.4–15.2)

## 2023-01-20 LAB — COMPREHENSIVE METABOLIC PANEL
ALT: 30 U/L (ref 0–44)
AST: 24 U/L (ref 15–41)
Albumin: 4.3 g/dL (ref 3.5–5.0)
Alkaline Phosphatase: 58 U/L (ref 38–126)
Anion gap: 8 (ref 5–15)
BUN: 14 mg/dL (ref 8–23)
CO2: 28 mmol/L (ref 22–32)
Calcium: 9.4 mg/dL (ref 8.9–10.3)
Chloride: 104 mmol/L (ref 98–111)
Creatinine, Ser: 0.9 mg/dL (ref 0.61–1.24)
GFR, Estimated: >60 mL/min (ref >60)
Glucose, Bld: 134 mg/dL — ABNORMAL HIGH (ref 70–99)
Potassium: 3.7 mmol/L (ref 3.5–5.1)
Sodium: 140 mmol/L (ref 135–145)
Total Bilirubin: 1.2 mg/dL — ABNORMAL HIGH (ref <1.2)
Total Protein: 7.3 g/dL (ref 6.5–8.1)

## 2023-01-20 LAB — TYPE AND SCREEN
ABO/RH(D): A POS
Antibody Screen: NEGATIVE

## 2023-01-20 LAB — APTT: aPTT: 28 s (ref 24–36)

## 2023-01-20 NOTE — Progress Notes (Signed)
PCP - Dr. Merri Brunette Cardiologist - Denies  PPM/ICD - Denies Device Orders - n/a Rep Notified - n/a  Chest x-ray - 01/20/2023 EKG - 01/20/2023 Stress Test - denies ECHO - Denies Cardiac Cath - Denies CT Coronary - 02/25/2021  Sleep Study - +OSA. Pt wears CPAP nightly. Pressure setting is 9  No DM  Last dose of GLP1 agonist- n/a  GLP1 instructions: n/a  Blood Thinner Instructions: n/a Aspirin Instructions: Pt instructed to continue taking ASA through the day before surgery and NOT to take any the morning of surgery   NPO after midnight   COVID TEST- Yes. Result pending.   Anesthesia review: No.  Patient denies shortness of breath, fever, cough and chest pain at PAT appointment. Pt denies any respiratory illness/infection in the last two months.    All instructions explained to the patient, with a verbal understanding of the material. Patient agrees to go over the instructions while at home for a better understanding. Patient also instructed to self quarantine after being tested for COVID-19. The opportunity to ask questions was provided.

## 2023-01-21 LAB — SARS CORONAVIRUS 2 (TAT 6-24 HRS): SARS Coronavirus 2: NEGATIVE

## 2023-01-24 ENCOUNTER — Other Ambulatory Visit: Payer: Self-pay

## 2023-01-24 ENCOUNTER — Inpatient Hospital Stay (HOSPITAL_COMMUNITY): Payer: Medicare Other

## 2023-01-24 ENCOUNTER — Inpatient Hospital Stay (HOSPITAL_COMMUNITY): Payer: Medicare Other | Admitting: Anesthesiology

## 2023-01-24 ENCOUNTER — Encounter (HOSPITAL_COMMUNITY): Payer: Self-pay | Admitting: Thoracic Surgery (Cardiothoracic Vascular Surgery)

## 2023-01-24 ENCOUNTER — Inpatient Hospital Stay (HOSPITAL_COMMUNITY)
Admission: RE | Admit: 2023-01-24 | Discharge: 2023-01-26 | DRG: 164 | Disposition: A | Discharge status: Home / Self Care | Payer: Medicare Other | Attending: Thoracic Surgery (Cardiothoracic Vascular Surgery) | Admitting: Thoracic Surgery (Cardiothoracic Vascular Surgery)

## 2023-01-24 ENCOUNTER — Encounter (HOSPITAL_COMMUNITY)
Admission: RE | Disposition: A | Discharge status: Home / Self Care | Payer: Self-pay | Source: Home / Self Care | Attending: Thoracic Surgery (Cardiothoracic Vascular Surgery)

## 2023-01-24 DIAGNOSIS — J439 Emphysema, unspecified: Secondary | ICD-10-CM | POA: Diagnosis present

## 2023-01-24 DIAGNOSIS — E669 Obesity, unspecified: Secondary | ICD-10-CM | POA: Diagnosis present

## 2023-01-24 DIAGNOSIS — Z801 Family history of malignant neoplasm of trachea, bronchus and lung: Secondary | ICD-10-CM | POA: Diagnosis not present

## 2023-01-24 DIAGNOSIS — J9383 Other pneumothorax: Secondary | ICD-10-CM | POA: Diagnosis not present

## 2023-01-24 DIAGNOSIS — R911 Solitary pulmonary nodule: Principal | ICD-10-CM | POA: Diagnosis present

## 2023-01-24 DIAGNOSIS — Z7982 Long term (current) use of aspirin: Secondary | ICD-10-CM | POA: Diagnosis not present

## 2023-01-24 DIAGNOSIS — I440 Atrioventricular block, first degree: Secondary | ICD-10-CM | POA: Diagnosis present

## 2023-01-24 DIAGNOSIS — G4730 Sleep apnea, unspecified: Secondary | ICD-10-CM | POA: Diagnosis present

## 2023-01-24 DIAGNOSIS — J9811 Atelectasis: Secondary | ICD-10-CM | POA: Diagnosis not present

## 2023-01-24 DIAGNOSIS — Z6831 Body mass index (BMI) 31.0-31.9, adult: Secondary | ICD-10-CM | POA: Diagnosis not present

## 2023-01-24 DIAGNOSIS — Z87891 Personal history of nicotine dependence: Secondary | ICD-10-CM | POA: Diagnosis not present

## 2023-01-24 DIAGNOSIS — Z79899 Other long term (current) drug therapy: Secondary | ICD-10-CM

## 2023-01-24 DIAGNOSIS — I2510 Atherosclerotic heart disease of native coronary artery without angina pectoris: Secondary | ICD-10-CM | POA: Diagnosis present

## 2023-01-24 DIAGNOSIS — J8410 Pulmonary fibrosis, unspecified: Secondary | ICD-10-CM | POA: Diagnosis present

## 2023-01-24 DIAGNOSIS — E785 Hyperlipidemia, unspecified: Secondary | ICD-10-CM | POA: Diagnosis present

## 2023-01-24 DIAGNOSIS — Z8249 Family history of ischemic heart disease and other diseases of the circulatory system: Secondary | ICD-10-CM

## 2023-01-24 HISTORY — PX: INTERCOSTAL NERVE BLOCK: SHX5021

## 2023-01-24 HISTORY — PX: NODE DISSECTION: SHX5269

## 2023-01-24 HISTORY — PX: XI ROBOTIC ASSISTED THORACOSCOPY- SEGMENTECTOMY: SHX6881

## 2023-01-24 LAB — ABO/RH: ABO/RH(D): A POS

## 2023-01-24 SURGERY — RESECTION, LUNG, SEGMENTAL, ROBOT-ASSISTED
Anesthesia: General | Site: Chest | Laterality: Left

## 2023-01-24 MED ORDER — ROCURONIUM BROMIDE 10 MG/ML (PF) SYRINGE
PREFILLED_SYRINGE | INTRAVENOUS | Status: AC
  Filled 2023-01-24: qty 10

## 2023-01-24 MED ORDER — CHLORHEXIDINE GLUCONATE 0.12 % MT SOLN
15.0000 mL | OROMUCOSAL | Status: AC
  Administered 2023-01-24: 15 mL via OROMUCOSAL
  Filled 2023-01-24 (×2): qty 15

## 2023-01-24 MED ORDER — ASPIRIN 81 MG PO TBEC
81.0000 mg | DELAYED_RELEASE_TABLET | Freq: Every day | ORAL | Status: DC
  Administered 2023-01-25: 81 mg via ORAL
  Filled 2023-01-24: qty 1

## 2023-01-24 MED ORDER — OXYCODONE HCL 5 MG/5ML PO SOLN: 5.0000 mg | Freq: Once | ORAL | Status: DC | PRN

## 2023-01-24 MED ORDER — ACETAMINOPHEN 500 MG PO TABS
ORAL_TABLET | ORAL | Status: AC
  Administered 2023-01-24: 1000 mg via ORAL
  Filled 2023-01-24: qty 2

## 2023-01-24 MED ORDER — SUGAMMADEX SODIUM 200 MG/2ML IV SOLN
INTRAVENOUS | Status: DC | PRN
  Administered 2023-01-24: 200 mg via INTRAVENOUS

## 2023-01-24 MED ORDER — BUPIVACAINE LIPOSOME 1.3 % IJ SUSP
INTRAMUSCULAR | Status: AC
  Filled 2023-01-24: qty 20

## 2023-01-24 MED ORDER — BISACODYL 5 MG PO TBEC
10.0000 mg | DELAYED_RELEASE_TABLET | Freq: Every day | ORAL | Status: DC
  Administered 2023-01-25 – 2023-01-26 (×2): 10 mg via ORAL
  Filled 2023-01-24 (×2): qty 2

## 2023-01-24 MED ORDER — CEFAZOLIN SODIUM-DEXTROSE 2-4 GM/100ML-% IV SOLN
2.0000 g | Freq: Three times a day (TID) | INTRAVENOUS | Status: AC
  Administered 2023-01-25 (×2): 2 g via INTRAVENOUS
  Filled 2023-01-24 (×2): qty 100

## 2023-01-24 MED ORDER — ROCURONIUM BROMIDE 10 MG/ML (PF) SYRINGE
PREFILLED_SYRINGE | INTRAVENOUS | Status: DC | PRN
  Administered 2023-01-24: 20 mg via INTRAVENOUS
  Administered 2023-01-24: 80 mg via INTRAVENOUS
  Administered 2023-01-24: 20 mg via INTRAVENOUS

## 2023-01-24 MED ORDER — SODIUM CHLORIDE FLUSH 0.9 % IV SOLN
INTRAVENOUS | Status: DC | PRN
  Administered 2023-01-24: 95 mL

## 2023-01-24 MED ORDER — LACTATED RINGERS IV SOLN: INTRAVENOUS | Status: DC | PRN

## 2023-01-24 MED ORDER — ACETAMINOPHEN 160 MG/5ML PO SOLN: 1000.0000 mg | Freq: Four times a day (QID) | ORAL | Status: DC

## 2023-01-24 MED ORDER — PROSIGHT PO TABS
1.0000 | ORAL_TABLET | Freq: Every day | ORAL | Status: DC
  Administered 2023-01-25 – 2023-01-26 (×2): 1 via ORAL
  Filled 2023-01-24 (×2): qty 1

## 2023-01-24 MED ORDER — ACETAMINOPHEN 500 MG PO TABS
1000.0000 mg | ORAL_TABLET | Freq: Four times a day (QID) | ORAL | Status: DC
  Administered 2023-01-25 (×3): 1000 mg via ORAL
  Filled 2023-01-24 (×4): qty 2

## 2023-01-24 MED ORDER — FENTANYL CITRATE (PF) 100 MCG/2ML IJ SOLN: 25.0000 ug | INTRAMUSCULAR | Status: DC | PRN

## 2023-01-24 MED ORDER — ONDANSETRON HCL 4 MG/2ML IJ SOLN: 4.0000 mg | Freq: Four times a day (QID) | INTRAMUSCULAR | Status: DC | PRN

## 2023-01-24 MED ORDER — OXYCODONE HCL 5 MG PO TABS
5.0000 mg | ORAL_TABLET | Freq: Once | ORAL | Status: DC | PRN
Start: 2023-01-24 — End: 2023-01-24

## 2023-01-24 MED ORDER — ENOXAPARIN SODIUM 40 MG/0.4ML IJ SOSY
40.0000 mg | PREFILLED_SYRINGE | INTRAMUSCULAR | Status: DC
  Administered 2023-01-25: 40 mg via SUBCUTANEOUS
  Filled 2023-01-24: qty 0.4

## 2023-01-24 MED ORDER — OCUVITE ADULT 50+ PO CAPS: ORAL_CAPSULE | Freq: Every day | ORAL | Status: DC

## 2023-01-24 MED ORDER — GABAPENTIN 300 MG PO CAPS
300.0000 mg | ORAL_CAPSULE | Freq: Every day | ORAL | Status: DC
  Administered 2023-01-24 – 2023-01-25 (×2): 300 mg via ORAL
  Filled 2023-01-24 (×2): qty 1

## 2023-01-24 MED ORDER — FENTANYL CITRATE (PF) 250 MCG/5ML IJ SOLN
INTRAMUSCULAR | Status: AC
  Filled 2023-01-24: qty 5

## 2023-01-24 MED ORDER — PROPOFOL 10 MG/ML IV BOLUS
INTRAVENOUS | Status: DC | PRN
  Administered 2023-01-24: 200 mg via INTRAVENOUS

## 2023-01-24 MED ORDER — LUNG SURGERY BOOK
Freq: Once | Status: AC
  Filled 2023-01-24: qty 1

## 2023-01-24 MED ORDER — FENTANYL CITRATE PF 50 MCG/ML IJ SOSY: 25.0000 ug | PREFILLED_SYRINGE | INTRAMUSCULAR | Status: DC | PRN

## 2023-01-24 MED ORDER — INDOCYANINE GREEN 25 MG IV SOLR
INTRAVENOUS | Status: DC | PRN
  Administered 2023-01-24: 12.5 mg via INTRAVENOUS

## 2023-01-24 MED ORDER — FENTANYL CITRATE (PF) 250 MCG/5ML IJ SOLN
INTRAMUSCULAR | Status: DC | PRN
  Administered 2023-01-24: 50 ug via INTRAVENOUS
  Administered 2023-01-24: 100 ug via INTRAVENOUS

## 2023-01-24 MED ORDER — LABETALOL HCL 5 MG/ML IV SOLN
INTRAVENOUS | Status: AC
  Filled 2023-01-24: qty 4

## 2023-01-24 MED ORDER — SODIUM CHLORIDE 0.9 % IR SOLN
Status: DC | PRN
  Administered 2023-01-24: 1000 mL

## 2023-01-24 MED ORDER — BUPIVACAINE HCL (PF) 0.5 % IJ SOLN
INTRAMUSCULAR | Status: AC
  Filled 2023-01-24: qty 30

## 2023-01-24 MED ORDER — PHENYLEPHRINE HCL-NACL 20-0.9 MG/250ML-% IV SOLN
INTRAVENOUS | Status: DC | PRN
  Administered 2023-01-24: 40 ug/min via INTRAVENOUS

## 2023-01-24 MED ORDER — LIDOCAINE 2% (20 MG/ML) 5 ML SYRINGE
INTRAMUSCULAR | Status: DC | PRN
  Administered 2023-01-24: 100 mg via INTRAVENOUS

## 2023-01-24 MED ORDER — CEFAZOLIN SODIUM-DEXTROSE 2-4 GM/100ML-% IV SOLN
2.0000 g | INTRAVENOUS | Status: AC
  Administered 2023-01-24 (×2): 2 g via INTRAVENOUS
  Filled 2023-01-24: qty 100

## 2023-01-24 MED ORDER — TRAMADOL HCL 50 MG PO TABS
50.0000 mg | ORAL_TABLET | Freq: Four times a day (QID) | ORAL | Status: DC | PRN
  Administered 2023-01-24 – 2023-01-26 (×2): 50 mg via ORAL
  Filled 2023-01-24 (×2): qty 1

## 2023-01-24 MED ORDER — KETOROLAC TROMETHAMINE 15 MG/ML IJ SOLN
15.0000 mg | Freq: Four times a day (QID) | INTRAMUSCULAR | Status: DC
  Administered 2023-01-24 – 2023-01-26 (×5): 15 mg via INTRAVENOUS
  Filled 2023-01-24 (×5): qty 1

## 2023-01-24 MED ORDER — SODIUM CHLORIDE 0.9 % IV SOLN: INTRAVENOUS | Status: DC

## 2023-01-24 MED ORDER — CEFAZOLIN SODIUM 1 G IJ SOLR
INTRAMUSCULAR | Status: AC
  Filled 2023-01-24: qty 20

## 2023-01-24 MED ORDER — PANTOPRAZOLE SODIUM 40 MG PO TBEC
40.0000 mg | DELAYED_RELEASE_TABLET | Freq: Every day | ORAL | Status: DC
  Administered 2023-01-25 – 2023-01-26 (×2): 40 mg via ORAL
  Filled 2023-01-24 (×2): qty 1

## 2023-01-24 MED ORDER — ONDANSETRON HCL 4 MG/2ML IJ SOLN: 4.0000 mg | Freq: Once | INTRAMUSCULAR | Status: DC | PRN

## 2023-01-24 MED ORDER — OXYCODONE HCL 5 MG PO TABS
5.0000 mg | ORAL_TABLET | ORAL | Status: DC | PRN
  Administered 2023-01-25: 5 mg via ORAL
  Administered 2023-01-25: 10 mg via ORAL
  Filled 2023-01-24: qty 1
  Filled 2023-01-24: qty 2

## 2023-01-24 MED ORDER — ONDANSETRON HCL 4 MG/2ML IJ SOLN
INTRAMUSCULAR | Status: DC | PRN
  Administered 2023-01-24: 4 mg via INTRAVENOUS

## 2023-01-24 MED ORDER — ADULT MULTIVITAMIN W/MINERALS CH
1.0000 | ORAL_TABLET | Freq: Every day | ORAL | Status: DC
  Administered 2023-01-25: 1 via ORAL
  Filled 2023-01-24: qty 1

## 2023-01-24 MED ORDER — HYDROMORPHONE HCL 1 MG/ML IJ SOLN
INTRAMUSCULAR | Status: AC
  Filled 2023-01-24: qty 0.5

## 2023-01-24 MED ORDER — CHLORHEXIDINE GLUCONATE CLOTH 2 % EX PADS
6.0000 | MEDICATED_PAD | Freq: Every day | CUTANEOUS | Status: DC
  Administered 2023-01-24 – 2023-01-26 (×3): 6 via TOPICAL

## 2023-01-24 MED ORDER — SUGAMMADEX SODIUM 200 MG/2ML IV SOLN: INTRAVENOUS | Status: DC | PRN

## 2023-01-24 MED ORDER — GABAPENTIN 300 MG PO CAPS: 300.0000 mg | ORAL_CAPSULE | Freq: Two times a day (BID) | ORAL | Status: DC

## 2023-01-24 MED ORDER — DEXAMETHASONE SODIUM PHOSPHATE 10 MG/ML IJ SOLN
INTRAMUSCULAR | Status: DC | PRN
  Administered 2023-01-24: 10 mg via INTRAVENOUS

## 2023-01-24 MED ORDER — 0.9 % SODIUM CHLORIDE (POUR BTL) OPTIME
TOPICAL | Status: DC | PRN
  Administered 2023-01-24: 2000 mL

## 2023-01-24 MED ORDER — ATORVASTATIN CALCIUM 10 MG PO TABS
20.0000 mg | ORAL_TABLET | Freq: Every day | ORAL | Status: DC
Start: 2023-01-25 — End: 2023-01-26
  Administered 2023-01-25 – 2023-01-26 (×2): 20 mg via ORAL
  Filled 2023-01-24 (×2): qty 2

## 2023-01-24 MED ORDER — SENNOSIDES-DOCUSATE SODIUM 8.6-50 MG PO TABS: 1.0000 | ORAL_TABLET | Freq: Every day | ORAL | Status: DC

## 2023-01-24 MED ORDER — HYDROMORPHONE HCL 1 MG/ML IJ SOLN
INTRAMUSCULAR | Status: DC | PRN
  Administered 2023-01-24 (×2): .5 mg via INTRAVENOUS

## 2023-01-24 MED ORDER — ACETAMINOPHEN 500 MG PO TABS: 1000.0000 mg | ORAL_TABLET | Freq: Once | ORAL | Status: AC

## 2023-01-24 SURGICAL SUPPLY — 68 items
CANISTER SUCT 3000ML PPV (MISCELLANEOUS) ×4 IMPLANT
CANNULA REDUCER 12-8 DVNC XI (CANNULA) ×4 IMPLANT
CATH THORACIC 28FR (CATHETERS) IMPLANT
CLIP LIGATING HEMO O LOK GREEN (MISCELLANEOUS) IMPLANT
CNTNR URN SCR LID CUP LEK RST (MISCELLANEOUS) ×10 IMPLANT
CONN ST 1/4X3/8 BEN (MISCELLANEOUS) IMPLANT
DEFOGGER SCOPE WARMER CLEARIFY (MISCELLANEOUS) ×2 IMPLANT
DERMABOND ADVANCED .7 DNX12 (GAUZE/BANDAGES/DRESSINGS) ×2 IMPLANT
DRAPE ARM DVNC X/XI (DISPOSABLE) ×8 IMPLANT
DRAPE COLUMN DVNC XI (DISPOSABLE) ×2 IMPLANT
DRAPE CV SPLIT W-CLR ANES SCRN (DRAPES) ×2 IMPLANT
DRAPE HALF SHEET 40X57 (DRAPES) ×2 IMPLANT
DRAPE INCISE IOBAN 66X45 STRL (DRAPES) IMPLANT
DRAPE SURG ORHT 6 SPLT 77X108 (DRAPES) ×2 IMPLANT
ELECT BLADE 6.5 EXT (BLADE) IMPLANT
ELECT REM PT RETURN 9FT ADLT (ELECTROSURGICAL) ×1
ELECTRODE REM PT RTRN 9FT ADLT (ELECTROSURGICAL) ×2 IMPLANT
FORCEPS BPLR FENES DVNC XI (FORCEP) IMPLANT
FORCEPS BPLR LNG DVNC XI (INSTRUMENTS) IMPLANT
GAUZE KITTNER 4X5 RF (MISCELLANEOUS) IMPLANT
GAUZE SPONGE 4X4 12PLY STRL (GAUZE/BANDAGES/DRESSINGS) ×2 IMPLANT
GLOVE SS BIOGEL STRL SZ 7.5 (GLOVE) ×2 IMPLANT
GOWN STRL REUS W/ TWL LRG LVL3 (GOWN DISPOSABLE) ×4 IMPLANT
GOWN STRL REUS W/ TWL XL LVL3 (GOWN DISPOSABLE) ×4 IMPLANT
GOWN STRL REUS W/TWL 2XL LVL3 (GOWN DISPOSABLE) ×2 IMPLANT
GRASPER TIP-UP FEN DVNC XI (INSTRUMENTS) IMPLANT
HEMOSTAT SURGICEL 2X14 (HEMOSTASIS) ×8 IMPLANT
IRRIGATION STRYKERFLOW (MISCELLANEOUS) ×2 IMPLANT
IRRIGATOR STRYKERFLOW (MISCELLANEOUS) ×1
KIT BASIN OR (CUSTOM PROCEDURE TRAY) ×2 IMPLANT
NDL HYPO 25GX1X1/2 BEV (NEEDLE) ×2 IMPLANT
NEEDLE HYPO 25GX1X1/2 BEV (NEEDLE) ×1 IMPLANT
NS IRRIG 1000ML POUR BTL (IV SOLUTION) ×2 IMPLANT
PACK CHEST (CUSTOM PROCEDURE TRAY) ×2 IMPLANT
PAD ARMBOARD 7.5X6 YLW CONV (MISCELLANEOUS) ×4 IMPLANT
RELOAD STAPLE 45 2.0 GRY DVNC (STAPLE) IMPLANT
RELOAD STAPLE 45 2.5 WHT DVNC (STAPLE) IMPLANT
RELOAD STAPLE 45 3.5 BLU DVNC (STAPLE) IMPLANT
RELOAD STAPLE 45 4.3 GRN DVNC (STAPLE) IMPLANT
RELOAD STAPLE 45 4.6 BLK DVNC (STAPLE) IMPLANT
RELOAD STAPLER 2.5X45 WHT DVNC (STAPLE) ×1 IMPLANT
RELOAD STAPLER 3.5X45 BLU DVNC (STAPLE) ×3 IMPLANT
RELOAD STAPLER 4.3X45 GRN DVNC (STAPLE) ×4 IMPLANT
RELOAD STAPLER 45 4.6 BLK DVNC (STAPLE) ×2 IMPLANT
SEAL UNIV 5-12 XI (MISCELLANEOUS) ×8 IMPLANT
SET TRI-LUMEN FLTR TB AIRSEAL (TUBING) ×2 IMPLANT
SOL ELECTROSURG ANTI STICK (MISCELLANEOUS) ×1
SOLUTION ELECTROSURG ANTI STCK (MISCELLANEOUS) ×2 IMPLANT
SPONGE TONSIL 1 RF SGL (DISPOSABLE) IMPLANT
STAPLE RELOAD 45 2.0 GRAY DVNC (STAPLE) ×1 IMPLANT
STAPLER 45 SUREFORM CVD DVNC (STAPLE) IMPLANT
STAPLER RELOAD 2.5X45 WHT DVNC (STAPLE) ×1
STAPLER RELOAD 3.5X45 BLU DVNC (STAPLE) ×3
STAPLER RELOAD 4.3X45 GRN DVNC (STAPLE) ×4
STAPLER RELOAD 45 4.6 BLK DVNC (STAPLE) ×2
SUT SILK 1 MH (SUTURE) ×4 IMPLANT
SUT SILK 2 0 SH (SUTURE) IMPLANT
SUT VIC AB 2-0 CTX 36 (SUTURE) IMPLANT
SUT VIC AB 3-0 X1 27 (SUTURE) ×2 IMPLANT
SUT VICRYL 0 TIES 12 18 (SUTURE) ×2 IMPLANT
SUT VICRYL 0 UR6 27IN ABS (SUTURE) ×4 IMPLANT
SYR 20CC LL (SYRINGE) ×4 IMPLANT
SYSTEM RETRIEVAL ANCHOR 8 (MISCELLANEOUS) IMPLANT
SYSTEM SAHARA CHEST DRAIN ATS (WOUND CARE) ×2 IMPLANT
TAPE CLOTH 4X10 WHT NS (GAUZE/BANDAGES/DRESSINGS) ×2 IMPLANT
TOWEL GREEN STERILE (TOWEL DISPOSABLE) ×2 IMPLANT
TRAY FOLEY MTR SLVR 16FR STAT (SET/KITS/TRAYS/PACK) ×2 IMPLANT
WATER STERILE IRR 1000ML POUR (IV SOLUTION) ×2 IMPLANT

## 2023-01-24 NOTE — Anesthesia Procedure Notes (Signed)
Procedure Name: Intubation Date/Time: 01/24/2023 12:13 PM  Performed by: Allyn Kenner, CRNAPre-anesthesia Checklist: Patient identified, Emergency Drugs available, Suction available and Patient being monitored Patient Re-evaluated:Patient Re-evaluated prior to induction Oxygen Delivery Method: Circle System Utilized Preoxygenation: Pre-oxygenation with 100% oxygen Induction Type: IV induction Ventilation: Mask ventilation without difficulty Laryngoscope Size: Mac and 4 Grade View: Grade I Tube type: Oral Endobronchial tube: Double lumen EBT and 39 Fr Number of attempts: 1 Airway Equipment and Method: Stylet and Oral airway Placement Confirmation: ETT inserted through vocal cords under direct vision, positive ETCO2 and breath sounds checked- equal and bilateral Secured at: 30 cm Tube secured with: Tape Dental Injury: Teeth and Oropharynx as per pre-operative assessment

## 2023-01-24 NOTE — Brief Op Note (Addendum)
01/24/2023  4:06 PM  PATIENT:  Cody Michael  77 y.o. male  PRE-OPERATIVE DIAGNOSIS:  LEFT LOWER LOBE LUNG NODULE  POST-OPERATIVE DIAGNOSIS:  LEFT LOWER LOBE LUNG NODULE  PROCEDURE:  Procedure(s): XI ROBOTIC ASSISTED THORACOSCOPY-LEFT LOWER LOBE SUPERIOR SEGMENTECTOMY (Left) NODE DISSECTION (Left) INTERCOSTAL NERVE BLOCK (Left)  SURGEON:  Surgeons and Role:    * Loreli Slot, MD - Primary  PHYSICIAN ASSISTANT: DONIELLE ZIMMERMAN PA-C,  WAYNE GOLD PA-C  ASSISTANTS: Alfonso Patten MD  ANESTHESIA:   general  EBL:  75 ML  BLOOD ADMINISTERED:none  DRAINS:  LEFT CHEST TUBE    LOCAL MEDICATIONS USED:  BUPIVICAINE  and EXPAREL  SPECIMEN:  Source of Specimen:  SEGMENTECTOMY , MULTIPLE LN SAMPLES  DISPOSITION OF SPECIMEN:  PATHOLOGY  COUNTS:  YES  TOURNIQUET:  * No tourniquets in log *  DICTATION: .Other Dictation: Dictation Number PENDING  PLAN OF CARE: Admit to inpatient   PATIENT DISPOSITION:  PACU - hemodynamically stable.   Delay start of Pharmacological VTE agent (>24hrs) due to surgical blood loss or risk of bleeding: no  COMPLICATIONS: NO KNOWN

## 2023-01-24 NOTE — Op Note (Signed)
NAME: Cody Michael, Cody Michael MEDICAL RECORD NO: [id number] ACCOUNT NO: [id number] DATE OF BIRTH: Feb 19, 1945 FACILITY: [facility] LOCATION: [facility] PHYSICIAN: Salvatore Decent. Dorris Fetch, MD  Operative Report   DATE OF PROCEDURE: 01/24/2023  PREOPERATIVE DIAGNOSIS:  Left lower lobe lung nodule.  POSTOPERATIVE DIAGNOSIS:  Left lower lobe lung nodule.  PROCEDURE:   Xi robotic-assisted left lower lobe superior segmentectomy,  Lymph node dissection, and  Intercostal nerve blocks levels 3 through 10.  SURGEON:  Salvatore Decent. Dorris Fetch, MD  ASSISTANT:  Jayme Cloud, MD  PHYSICIAN ASSISTANTS: 1. Doree Fudge, PA 2. Gershon Crane, [place].  ANESTHESIA:  General.  FINDINGS:  Area of thickening, but no tumor seen on frozen section.  Subpleural node adjacent to staple line removed and sent a separate specimen, benign in appearance.  CLINICAL NOTE:  Mr. Waggle is a 77 year old gentleman with known lung nodules.  He has a past history of smoking prior to quitting about 30 years ago.  Found to have a right middle lobe lung nodule on a CT for calcium screening in 02/2021.  At 1 year, there was no evidence of progression, but there was a ground glass opacity in the superior segment of the left lower lobe.  Over time, that nodule increased in size and developed a new solid component.  There was no significant activity on PET CT.  He was offered the option of observation versus surgical resection.  The procedure would be a left lower lobe superior segmentectomy for both diagnostic and therapeutic reasons.  The indications, risks, benefits, and alternatives were  discussed in detail with the patient.  He understood and accepted the risks and agreed to proceed.  OPERATIVE NOTE:  Mr. Luers was brought to the operating room on 01/24/2023.  He had induction of general anesthesia and was intubated with a double-lumen endotracheal tube.  Intravenous antibiotics were administered.  Sequential compression devices were placed on  the calves for DVT prophylaxis.  A Foley catheter was placed.  He was placed in a right lateral decubitus position.  A Bair Hugger was placed for active warming.  The left chest was prepped and draped in the usual sterile fashion.  Single lung ventilation of the right lung was initiated and was tolerated well throughout the procedure.  A timeout was performed.  A solution containing 20 mL of liposomal bupivacaine, 30 mL of 0.5% bupivacaine, and 50 mL of saline was prepared.  This solution was used for local at the incision sites as well as for the intercostal nerve blocks.  An incision was made in approximately the midaxillary line in the eighth interspace and an 8-mm robotic port was inserted.  The thoracoscope was advanced into the chest after confirming intrapleural placement.  Carbon dioxide was insufflated per protocol.  A 12-mm robotic port was placed in the eighth interspace anterior to the camera port.  Intercostal nerve blocks then were performed from the third to the tenth interspace by injecting 10 mL of the bupivacaine solution into a subpleural plane at each level.  A 12-mm AirSeal port was placed in the 10th interspace posterolaterally and then two additional robotic ports were placed in the eighth interspace.  The robot was deployed.  The camera arm was docked.  Targeting was performed.  The remaining arms were docked.   The robotic instruments were inserted with thoracoscopic visualization.  The lung was retracted superiorly and the inferior ligament was divided with bipolar cautery.  Level 9 nodes were removed.  The lung then was retracted anteriorly and the pleural reflection  was divided at the hilum posteriorly.  Level 7 nodes as well as a level 10 node were removed.  Also, a level 12 node was dissected out near the takeoff of the superior segmental artery.  The pleura was cleared off the pulmonary artery.  The lung then was retracted inferiorly and the pleural reflection was divided at the  hilum superiorly.  A level 5 node was removed.  There was some minor bleeding and Surgicel was placed into that space.  The fissure was then was inspected.  It was nearly complete in its mid portion.  The pleura was dissected off the pulmonary artery in that area.  From the hilum, a level 10 node and then the level 11 node were removed.  A plane was then developed superficial to the pulmonary artery and the fissure was completed posteriorly using the robotic stapler.  The superior segmental  pulmonary artery branch was dissected out.  This was unusual anatomy as it crossed over the basilar segmental artery after its origin.  It was carefully encircled and divided with the robotic stapler.  Next, the superior segmental bronchus was encircled.  The stapler was placed across the segmental bronchus and closed.  The test inflation showed good aeration of the remainder of the lower lobe.  The stapler was fired transecting the segmental bronchus and then the superior segmental vein was divided using the robotic stapler as well.  10 mL of ICG was injected intravenously.  There was a clear demarcation of the superior segment from the remainder of the lower lobe.  This demarcation was scored with cautery.  The segmentectomy then was completed with sequential firings of the robotic stapler using green and black cartridges encompassing the entire superior segment with the stapler biased towards the basilar segment.  There was a subpleural lymph node, which was benign in appearance in close proximity to the staple line.  That was stapled off separately and sent for permanent pathology.  The vessel loop and sponges used during the dissection were removed.  The chest was copiously irrigated with saline.  Test inflation to 30 cm of water pressure revealed good aeration of the lung with no significant air leak.  The two specimens were placed into an endoscopic retrieval bag and brought down to the lower portion of the chest.  The  robotic instruments were removed and the robot was undocked.  The specimen was removed.  Palpation revealed an area of firm consistency, but no solid nodule.  Frozen section of that area did not reveal any tumor.  Careful inspection of the CT scan was clear that segmentectomy would encompass the nodule, particularly given the bias to the basilar segments with the staple lines.  The chest was copiously irrigated with saline.  A final inspection was made for hemostasis at the staple lines and the port sites.  A 28-French chest tube was placed through the original port incision and secured with a #1 silk suture.  Dual lung ventilation was resumed.  The wounds were closed in standard fashion.  Dermabond was applied.  The chest tube was placed to a Pleura Evac on water seal.  The patient was placed back in a supine position.  He was extubated in the operating room and taken to the post-anesthetic care unit in good condition.  All sponge, needle, and instrument counts were correct at the end of the procedure.  Experienced assistance was necessary for this case due to surgical complexity.  Donne Hazel, chief resident at [facility],  served as the first Geophysicist/field seismologist and performed limited portions of the dissection under my direct supervision.  I was present for the entire case and performed the critical components of the case.  Dr. Mindi Slicker and the PAs also assisted with port placement, instrument exchange, robot docking and undocking, specimen retrieval, and suctioning during the procedure.   PUS D: 01/24/2023 6:22:06 pm T: 01/24/2023 7:52:00 pm  JOB: [id number]/ [id number]

## 2023-01-24 NOTE — Interval H&P Note (Signed)
History and Physical Interval Note:  01/24/2023 11:58 AM  Cody Michael  has presented today for surgery, with the diagnosis of LLL LUNG NODULE.  The various methods of treatment have been discussed with the patient and family. After consideration of risks, benefits and other options for treatment, the patient has consented to  Procedure(s): XI ROBOTIC ASSISTED THORACOSCOPY-LEFT LOWER LOBE SUPERIOR SEGMENTECTOMY (Left) as a surgical intervention.  The patient's history has been reviewed, patient examined, no change in status, stable for surgery.  I have reviewed the patient's chart and labs.  Questions were answered to the patient's satisfaction.     Loreli Slot

## 2023-01-24 NOTE — Hospital Course (Addendum)
HPI: This is a 77 year old gentleman with a history of hyperlipidemia, coronary calcification by CT, sleep apnea, and lung nodules.  He smoked about a pack a day for 25 years prior to quitting 30 years ago.  He had a CT for coronary calcium screening in January 2023.  Noted to have right middle lobe lung nodule.  A follow-up CT at 1 year showed no evidence of progression but there was a groundglass opacity in the superior segment of the left lower lobe.  He had a follow-up CT of that in August which showed the nodule was about 1 cm in size which was unchanged but there was a new 4 mm solid component.  PET/CT showed no significant hypermetabolic activity.  There was no mediastinal or hilar adenopathy.   He feels well.  He is not having any chest pain, pressure, tightness, or shortness of breath.  He is very active working around his property which is about 8 acres.  Lost about 12 pounds with Ozempic but his appetite is good and his weight has been stable recently.   Patient has a left lower lobe lung nodule-mixed density nodule with interval development of a solid component.  Highly suspicious for a low-grade adenocarcinoma.  Options include radiographic follow-up, bronchoscopic biopsy, and surgical resection.  No reason to continue radiographic follow-up visit has progressed.  Positive bronchoscopic biopsy with indicated surgery but a negative and would not rule out the possibility of cancer.  Given that he is a good surgical candidate, the best option is to proceed with surgical resection for definitive diagnosis and treatment at the same setting. Dr. Dorris Fetch discussed the need for robotic assisted left upper lobe superior segmentectomy. Potential risks, benefits, and complications of the surgery were discussed with the patient and he agreed to proceed with surgery.  Hospital Course: Patient underwent a Xi assisted left thoracoscopy, LUL, LN dissection, and intercostal nerve block (ribs 3 through 10).  He was extubated and transported from the OR to PACU in stable condition. Foley was removed and IVF were stopped post op day one. Chest tube was placed to water seal. There was no air leak. CXR 12/17 was stable. Chest tube was removed on 12/17. Same day chest x ray showed stable trace left apical pneumothorax . Patient has been tolerating a diet. He has been ambulating on room air with good oxygenation. All wounds are clean, dry, healing without signs of infection. PA/LAT CXR done 12/18 showed small, left apical pneumothorax and atelectasis at base. As discussed with Dr. Dorris Fetch, the patient is stable for discharge.

## 2023-01-24 NOTE — Transfer of Care (Signed)
Immediate Anesthesia Transfer of Care Note  Patient: Cody Michael  Procedure(s) Performed: XI ROBOTIC ASSISTED THORACOSCOPY-LEFT LOWER LOBE SUPERIOR SEGMENTECTOMY (Left: Chest) NODE DISSECTION (Left: Chest) INTERCOSTAL NERVE BLOCK (Left: Chest)  Patient Location: PACU  Anesthesia Type:General  Level of Consciousness: awake, alert , and oriented  Airway & Oxygen Therapy: Patient Spontanous Breathing and Patient connected to nasal cannula oxygen  Post-op Assessment: Report given to RN and Post -op Vital signs reviewed and stable  Post vital signs: Reviewed and stable  Last Vitals:  Vitals Value Taken Time  BP 148/86 01/24/23 1727  Temp    Pulse 91 01/24/23 1728  Resp 19 01/24/23 1728  SpO2 97 % 01/24/23 1728  Vitals shown include unfiled device data.  Last Pain:  Vitals:   01/24/23 1005  TempSrc: Oral         Complications: No notable events documented.

## 2023-01-24 NOTE — Anesthesia Preprocedure Evaluation (Addendum)
Anesthesia Evaluation  Patient identified by MRN, date of birth, ID band Patient awake    Reviewed: Allergy & Precautions, NPO status , Patient's Chart, lab work & pertinent test results  History of Anesthesia Complications Negative for: history of anesthetic complications  Airway Mallampati: II  TM Distance: >3 FB Neck ROM: Full    Dental  (+) Dental Advisory Given   Pulmonary asthma , sleep apnea and Continuous Positive Airway Pressure Ventilation , COPD, former smoker   Pulmonary exam normal        Cardiovascular negative cardio ROS Normal cardiovascular exam     Neuro/Psych negative neurological ROS  negative psych ROS   GI/Hepatic negative GI ROS, Neg liver ROS,,,  Endo/Other   Obesity   Renal/GU negative Renal ROS     Musculoskeletal negative musculoskeletal ROS (+)    Abdominal   Peds  Hematology negative hematology ROS (+)   Anesthesia Other Findings On GLP-1a, last dose over 1 week ago   Reproductive/Obstetrics                             Anesthesia Physical Anesthesia Plan  ASA: 3  Anesthesia Plan: General   Post-op Pain Management: Tylenol PO (pre-op)*   Induction: Intravenous  PONV Risk Score and Plan: 2 and Treatment may vary due to age or medical condition, Ondansetron and Propofol infusion  Airway Management Planned: Double Lumen EBT  Additional Equipment: Arterial line  Intra-op Plan:   Post-operative Plan: Extubation in OR  Informed Consent: I have reviewed the patients History and Physical, chart, labs and discussed the procedure including the risks, benefits and alternatives for the proposed anesthesia with the patient or authorized representative who has indicated his/her understanding and acceptance.     Dental advisory given  Plan Discussed with: CRNA and Anesthesiologist  Anesthesia Plan Comments:        Anesthesia Quick Evaluation

## 2023-01-24 NOTE — Anesthesia Procedure Notes (Signed)
Arterial Line Insertion Start/End12/16/2024 10:30 AM, 01/24/2023 10:35 AM Performed by: Darryl Nestle, CRNA, CRNA  Patient location: Pre-op. Preanesthetic checklist: patient identified, IV checked, site marked, risks and benefits discussed, surgical consent, monitors and equipment checked, pre-op evaluation, timeout performed and anesthesia consent Lidocaine 1% used for infiltration Left, radial was placed Catheter size: 20 G Hand hygiene performed  and maximum sterile barriers used   Attempts: 1 Procedure performed without using ultrasound guided technique. Following insertion, dressing applied and Biopatch. Post procedure assessment: normal and unchanged  Patient tolerated the procedure well with no immediate complications.

## 2023-01-24 NOTE — Discharge Instructions (Signed)
Robot-Assisted Thoracic Surgery, Care After The following information offers guidance on how to care for yourself after your procedure. Your health care provider may also give you more specific instructions. If you have problems or questions, contact your health care provider. What can I expect after the procedure? After the procedure, it is common to have: Some pain and aches in the area of your surgical incisions. Pain when breathing in (inhaling) and coughing. Tiredness (fatigue). Trouble sleeping. Constipation. Follow these instructions at home: Medicines Take over-the-counter and prescription medicines only as told by your health care provider. If you were prescribed an antibiotic medicine, take it as told by your health care provider. Do not stop taking the antibiotic even if you start to feel better. Talk with your health care provider about safe and effective ways to manage pain after your procedure. Pain management should fit your specific health needs. Take pain medicine before pain becomes severe. Relieving and controlling your pain will make breathing easier for you. Ask your health care provider if the medicine prescribed to you requires you to avoid driving or using machinery. Eating and drinking Follow instructions from your health care provider about eating or drinking restrictions. These will vary depending on what procedure you had. Your health care provider may recommend: A liquid diet or soft diet for the first few days. Meals that are smaller and more frequent. A diet of fruits, vegetables, whole grains, and low-fat proteins. Limiting foods that are high in fat and processed sugar, including fried or sweet foods. Incision care Follow instructions from your health care provider about how to take care of your incisions. Make sure you: Wash your hands with soap and water for at least 20 seconds before and after you change your bandage (dressing). If soap and water are not  available, use hand sanitizer. Change your dressing as told by your health care provider. Leave stitches (sutures), skin glue, or adhesive strips in place. These skin closures may need to stay in place for 2 weeks or longer. If adhesive strip edges start to loosen and curl up, you may trim the loose edges. Do not remove adhesive strips completely unless your health care provider tells you to do that. Check your incision area every day for signs of infection. Check for: Redness, swelling, or more pain. Fluid or blood. Warmth. Pus or a bad smell. Activity Return to your normal activities as told by your health care provider. Ask your health care provider what activities are safe for you. Ask your health care provider when it is safe for you to drive. Do not lift anything that is heavier than 10 lb (4.5 kg), or the limit that you are told, until your health care provider says that it is safe. Rest as told by your health care provider. Avoid sitting for a long time without moving. Get up to take short walks every 1-2 hours. This is important to improve blood flow and breathing. Ask for help if you feel weak or unsteady. Do exercises as told by your health care provider. Pneumonia prevention  Do deep breathing exercises and cough regularly as directed. This helps clear mucus and opens your lungs. Doing this helps prevent lung infection (pneumonia). If you were given an incentive spirometer, use it as told. An incentive spirometer is a tool that measures how well you are filling your lungs with each breath. Coughing may hurt less if you try to support your chest. This is called splinting. Try one of these when you  cough: Hold a pillow against your chest. Place the palms of both hands on top of your incision area. Do not use any products that contain nicotine or tobacco. These products include cigarettes, chewing tobacco, and vaping devices, such as e-cigarettes. If you need help quitting, ask your  health care provider. Avoid secondhand smoke. General instructions If you have a drainage tube: Follow instructions from your health care provider about how to take care of it. Do not travel by airplane after your tube is removed until your health care provider tells you it is safe. You may need to take these actions to prevent or treat constipation: Drink enough fluid to keep your urine pale yellow. Take over-the-counter or prescription medicines. Eat foods that are high in fiber, such as beans, whole grains, and fresh fruits and vegetables. Limit foods that are high in fat and processed sugars, such as fried or sweet foods. Keep all follow-up visits. This is important. Contact a health care provider if: You have redness, swelling, or more pain around an incision. You have fluid or blood coming from an incision. An incision feels warm to the touch. You have pus or a bad smell coming from an incision. You have a fever. You cannot eat or drink without vomiting. Your pain medicine is not controlling your pain. Get help right away if: You have chest pain. Your heart is beating quickly. You have trouble breathing. You have trouble speaking. You are confused. You feel weak or dizzy, or you faint. These symptoms may represent a serious problem that is an emergency. Do not wait to see if the symptoms will go away. Get medical help right away. Call your local emergency services (911 in the [place] Do not drive yourself to the hospital. Summary Talk with your health care provider about safe and effective ways to manage pain after your procedure. Pain management should fit your specific health needs. Return to your normal activities as told by your health care provider. Ask your health care provider what activities are safe for you. Do deep breathing exercises and cough regularly as directed. This helps to clear mucus and prevent pneumonia. If it hurts to cough, ease pain by holding a pillow  against your chest or by placing the palms of both hands over your incisions. This information is not intended to replace advice given to you by your health care provider. Make sure you discuss any questions you have with your health care provider. Document Revised: 10/18/2019 Document Reviewed: 10/19/2019 Elsevier Patient Education  2024 [employer].

## 2023-01-24 NOTE — Discharge Summary (Signed)
[address]       [place] [address]             [phone]    Physician Discharge Summary  Patient ID: Cody Michael MRN: [id number] DOB/AGE: 03/17/1945 77 y.o.  Admit date: 01/24/2023 Discharge date: 01/26/2023  Admission Diagnoses:  Patient Active Problem List   Diagnosis Date Noted   Lung nodule 01/24/2023   Left lower lobe pulmonary nodule 01/24/2023   Aortic atherosclerosis (HCC) 01/05/2023   Mild emphysema (HCC) 01/05/2023   Lung nodules 01/05/2023   Coronary artery calcification seen on CAT scan 01/05/2023     Discharge Diagnoses:  Patient Active Problem List   Diagnosis Date Noted   Lung nodule 01/24/2023   Left lower lobe pulmonary nodule 01/24/2023   Aortic atherosclerosis (HCC) 01/05/2023   Mild emphysema (HCC) 01/05/2023   Lung nodules 01/05/2023   Coronary artery calcification seen on CAT scan 01/05/2023     Discharged Condition: Stable  HPI: This is a 77 year old gentleman with a history of hyperlipidemia, coronary calcification by CT, sleep apnea, and lung nodules.  He smoked about a pack a day for 25 years prior to quitting 30 years ago.  He had a CT for coronary calcium screening in January 2023.  Noted to have right middle lobe lung nodule.  A follow-up CT at 1 year showed no evidence of progression but there was a groundglass opacity in the superior segment of the left lower lobe.  He had a follow-up CT of that in August which showed the nodule was about 1 cm in size which was unchanged but there was a new 4 mm solid component.  PET/CT showed no significant hypermetabolic activity.  There was no mediastinal or hilar adenopathy.   He feels well.  He is not having any chest pain, pressure, tightness, or shortness of breath.  He is very active working around his property which is about [place] acres.  Lost about 12 pounds with Ozempic but his appetite is good and his weight has been stable recently.   Patient has a left lower lobe  lung nodule-mixed density nodule with interval development of a solid component.  Highly suspicious for a low-grade adenocarcinoma.  Options include radiographic follow-up, bronchoscopic biopsy, and surgical resection.  No reason to continue radiographic follow-up visit has progressed.  Positive bronchoscopic biopsy with indicated surgery but a negative and would not rule out the possibility of cancer.  Given that he is a good surgical candidate, the best option is to proceed with surgical resection for definitive diagnosis and treatment at the same setting. Dr. Dorris Fetch discussed the need for robotic assisted left upper lobe superior segmentectomy. Potential risks, benefits, and complications of the surgery were discussed with the patient and he agreed to proceed with surgery.  Hospital Course: Patient underwent a Xi assisted left thoracoscopy, LUL, LN dissection, and intercostal nerve block (ribs 3 through 10). He was extubated and transported from the OR to PACU in stable condition. Foley was removed and IVF were stopped post op day one. Chest tube was placed to water seal. There was no air leak. CXR 12/17 was stable. Chest tube was removed on 12/17. Same day chest x ray showed stable trace left apical pneumothorax . Patient has been tolerating a diet. He has been ambulating on room air with good oxygenation. All wounds are clean, dry, healing without signs of infection. PA/LAT CXR done 12/18 showed small, left apical pneumothorax and atelectasis at base. As  discussed with Dr. Dorris Fetch, the patient is stable for discharge.  Consults: None  Significant Diagnostic Studies:   Narrative & Impression  CLINICAL DATA:  Status post left lower lobectomy.   EXAM: CHEST - 2 VIEW   COMPARISON:  January 25, 2023.   FINDINGS: Stable cardiomediastinal silhouette. Right lung is clear. Stable small left apical pneumothorax is noted. Minimal left basilar subsegmental atelectasis is noted.    IMPRESSION: Small left apical pneumothorax. Minimal left basilar subsegmental atelectasis.     Electronically Signed   By: Lupita Raider M.D.   On: 01/26/2023 09:25   Narrative & Impression  CLINICAL DATA:  Pneumothorax, chest tube   EXAM: PORTABLE CHEST 1 VIEW   COMPARISON:  01/24/2023   FINDINGS: Single frontal view of the chest demonstrates stable position of the left chest tube. Stable trace left apical pneumothorax volume estimated far less than 5%. Mild vascular congestion without overt edema. No evidence of pleural effusion. Cardiac silhouette is stable.   IMPRESSION: 1. Stable trace left apical pneumothorax volume estimated far less than 5%. Stable left chest tube. 2. Vascular congestion without overt edema.     Electronically Signed   By: Sharlet Salina M.D.   On: 01/25/2023 09:22    Treatments: surgery:  Xi robotic-assisted left lower lobe superior segmentectomy, lymph node dissection, and intercostal nerve blocks levels 3 through 10 by Dr. Dorris Fetch on 01/24/2023.  Pathology: Final result is pending  Discharge Exam: Blood pressure 118/60, pulse 72, temperature 98.1 F (36.7 C), temperature source Oral, resp. rate 16, height 6' (1.829 m), weight 104.8 kg, SpO2 94%. General appearance: alert, cooperative, and no distress Neurologic: intact Heart: regular rate and rhythm Lungs: clear to auscultation bilaterally   Discharge Medications:   Allergies as of 01/26/2023   No Known Allergies      Medication List     STOP taking these medications    aspirin 81 MG tablet Replaced by: aspirin EC 81 MG tablet       TAKE these medications    aspirin EC 81 MG tablet Take 1 tablet (81 mg total) by mouth at bedtime. Swallow whole. Replaces: aspirin 81 MG tablet   atorvastatin 20 MG tablet Commonly known as: LIPITOR Take 20 mg by mouth daily.   BEE POLLEN PO Take 2 capsules by mouth daily.   FISH OIL ODOR-LESS PO Take 1 capsule by mouth  daily. Triple Omega   gabapentin 300 MG capsule Commonly known as: Neurontin Take 1 capsule (300 mg total) by mouth at bedtime.   multivitamin tablet Take 1 tablet by mouth at bedtime.   OCUVITE ADULT 50+ PO Take 1 tablet by mouth daily.   oxyCODONE 5 MG immediate release tablet Commonly known as: Oxy IR/ROXICODONE Take 1 tablet (5 mg total) by mouth every 6 (six) hours as needed for severe pain (pain score 7-10).   Ozempic (2 MG/DOSE) 8 MG/3ML Sopn Generic drug: Semaglutide (2 MG/DOSE) Inject 2 mg into the skin once a week.   PROBIOTIC PO Take 1 tablet by mouth daily. 900 billion   Vitamin D-3 125 MCG (5000 UT) Tabs Take 5,000 Units by mouth daily.        Follow-up Information     [facility]. Go on 02/16/2023.   Why: Please arrive by 3:00 pm in order to have a PA/LAT CXR taken PRIOR to office appointment Contact information: [address] [place] [address]        [facility]. Go  on 02/16/2023.   Specialty: Cardiothoracic Surgery Why: Appointment time is at 4:00 pm Contact information: [address] [place], [address] [place] [address] [phone]        [facility]. Go on 02/07/2023.   Specialty: Cardiothoracic Surgery Why: Appointment is for chest tube removal only. Appointment time 11:00 am Contact information: [address] [place], [address] [place] [address] [phone]                Signed:  Elenore Rota 01/26/2023, 9:42 AM

## 2023-01-25 ENCOUNTER — Encounter (HOSPITAL_COMMUNITY): Payer: Self-pay | Admitting: Thoracic Surgery (Cardiothoracic Vascular Surgery)

## 2023-01-25 ENCOUNTER — Encounter: Payer: Medicare Other | Admitting: Thoracic Surgery (Cardiothoracic Vascular Surgery)

## 2023-01-25 ENCOUNTER — Inpatient Hospital Stay (HOSPITAL_COMMUNITY): Payer: Medicare Other

## 2023-01-25 LAB — CBC
HCT: 35.8 % — ABNORMAL LOW (ref 39.0–52.0)
Hemoglobin: 12.3 g/dL — ABNORMAL LOW (ref 13.0–17.0)
MCH: 31.1 pg (ref 26.0–34.0)
MCHC: 34.4 g/dL (ref 30.0–36.0)
MCV: 90.6 fL (ref 80.0–100.0)
Platelets: 183 10*3/uL (ref 150–400)
RBC: 3.95 MIL/uL — ABNORMAL LOW (ref 4.22–5.81)
RDW: 12.5 % (ref 11.5–15.5)
WBC: 9.8 10*3/uL (ref 4.0–10.5)
nRBC: 0 % (ref 0.0–0.2)

## 2023-01-25 LAB — BASIC METABOLIC PANEL
Anion gap: 9 (ref 5–15)
BUN: 13 mg/dL (ref 8–23)
CO2: 20 mmol/L — ABNORMAL LOW (ref 22–32)
Calcium: 8 mg/dL — ABNORMAL LOW (ref 8.9–10.3)
Chloride: 106 mmol/L (ref 98–111)
Creatinine, Ser: 0.89 mg/dL (ref 0.61–1.24)
GFR, Estimated: >60 mL/min (ref >60)
Glucose, Bld: 154 mg/dL — ABNORMAL HIGH (ref 70–99)
Potassium: 3.8 mmol/L (ref 3.5–5.1)
Sodium: 135 mmol/L (ref 135–145)

## 2023-01-25 NOTE — TOC Initial Note (Signed)
Transition of Care [facility]) - Initial/Assessment Note    Patient Details  Name: Cody Michael MRN: [id number] Date of Birth: [birth year]  [facility] CM/SW Contact:    Lawerance Sabal, RN Phone Number: 01/25/2023, 12:16 PM  Clinical Narrative:                  Patient admitted from home, with spouse, for LLL Lung nodule removal yesterday.  Patient progressed to removing chest tube today, per progression, will hopefully wean to room air and DC tomorrow.   Expected Discharge Plan: Home/Self Care Barriers to Discharge: Continued Medical Work up   Patient Goals and CMS Choice Patient states their goals for this hospitalization and ongoing recovery are:: return home          Expected Discharge Plan and Services   Discharge Planning Services: CM Consult   Living arrangements for the past 2 months: Single Family Home                                      Prior Living Arrangements/Services Living arrangements for the past 2 months: Single Family Home Lives with:: Spouse                   Activities of Daily Living   ADL Screening (condition at time of admission) Independently performs ADLs?: Yes (appropriate for developmental age) Is the patient deaf or have difficulty hearing?: No Does the patient have difficulty seeing, even when wearing glasses/contacts?: No Does the patient have difficulty concentrating, remembering, or making decisions?: No  Permission Sought/Granted                  Emotional Assessment              Admission diagnosis:  Lung nodule [R91.1] Left lower lobe pulmonary nodule [R91.1] Patient Active Problem List   Diagnosis Date Noted   Lung nodule 01/24/2023   Left lower lobe pulmonary nodule 01/24/2023   Aortic atherosclerosis (HCC) 01/05/2023   Mild emphysema (HCC) 01/05/2023   Lung nodules 01/05/2023   Coronary artery calcification seen on CAT scan 01/05/2023   PCP:  Merri Brunette, MD Pharmacy:    [facility] [phone] [place], [place] - [address] [place] [address] Phone: [phone] Fax: [phone]     Social Drivers of Health (SDOH) Social History: SDOH Screenings   Food Insecurity: No Food Insecurity (01/24/2023)  Housing: Patient Declined (01/24/2023)  Transportation Needs: No Transportation Needs (01/24/2023)  Utilities: Not At Risk (01/24/2023)  Tobacco Use: Medium Risk (01/24/2023)   SDOH Interventions:     Readmission Risk Interventions     No data to display

## 2023-01-25 NOTE — Progress Notes (Signed)
Mobility Specialist Progress Note:   01/25/23 1400  Mobility  Activity Ambulated with assistance in hallway  Level of Assistance Contact guard assist, steadying assist  Assistive Device None  Distance Ambulated (ft) 340 ft  Activity Response Tolerated well  Mobility Referral Yes  Mobility visit 1 Mobility  Mobility Specialist Start Time (ACUTE ONLY) 1419  Mobility Specialist Stop Time (ACUTE ONLY) 1428  Mobility Specialist Time Calculation (min) (ACUTE ONLY) 9 min    Pre Mobility: 69 HR Post Mobility:  78 HR,  92% SpO2  Pt received in bed, agreeable to mobility. Some swaying during ambulation, though no overt LOB. No complaints throughout. Pt left in bed with call bell and all needs met. Family present.  D'Vante Earlene Plater Mobility Specialist Please contact via Special educational needs teacher or Rehab office at [phone]

## 2023-01-25 NOTE — Plan of Care (Signed)
  Problem: Education: Goal: Knowledge of General Education information will improve Description: Including pain rating scale, medication(s)/side effects and non-pharmacologic comfort measures Outcome: Progressing   Problem: Clinical Measurements: Goal: Will remain free from infection Outcome: Progressing Goal: Diagnostic test results will improve Outcome: Progressing Goal: Respiratory complications will improve Outcome: Progressing   Problem: Activity: Goal: Risk for activity intolerance will decrease Outcome: Progressing   Problem: Nutrition: Goal: Adequate nutrition will be maintained Outcome: Progressing   Problem: Coping: Goal: Level of anxiety will decrease Outcome: Progressing   Problem: Elimination: Goal: Will not experience complications related to bowel motility Outcome: Progressing   Problem: Pain Management: Goal: General experience of comfort will improve Outcome: Progressing   Problem: Safety: Goal: Ability to remain free from injury will improve Outcome: Progressing   Problem: Skin Integrity: Goal: Risk for impaired skin integrity will decrease Outcome: Progressing   Problem: Education: Goal: Knowledge of disease or condition will improve Outcome: Progressing   Problem: Activity: Goal: Risk for activity intolerance will decrease Outcome: Progressing

## 2023-01-25 NOTE — Anesthesia Postprocedure Evaluation (Signed)
Anesthesia Post Note  Patient: Cody Michael  Procedure(s) Performed: XI ROBOTIC ASSISTED THORACOSCOPY-LEFT LOWER LOBE SUPERIOR SEGMENTECTOMY (Left: Chest) NODE DISSECTION (Left: Chest) INTERCOSTAL NERVE BLOCK (Left: Chest)     Patient location during evaluation: PACU Anesthesia Type: General Level of consciousness: awake and alert Pain management: pain level controlled Vital Signs Assessment: post-procedure vital signs reviewed and stable Respiratory status: spontaneous breathing, nonlabored ventilation and respiratory function stable Cardiovascular status: stable and blood pressure returned to baseline Anesthetic complications: no   No notable events documented.  Last Vitals:  Vitals:   01/25/23 0000 01/25/23 0526  BP: 110/62 103/69  Pulse: 93   Resp: 17 20  Temp: 36.9 C 36.8 C  SpO2: 94% 96%    Last Pain:  Vitals:   01/25/23 0526  TempSrc: Oral  PainSc: 0-No pain                 Beryle Lathe

## 2023-01-25 NOTE — Progress Notes (Signed)
Chest tube and foley have both been removed with no complications

## 2023-01-25 NOTE — Progress Notes (Signed)
1 Day Post-Op Procedure(s) (LRB): XI ROBOTIC ASSISTED THORACOSCOPY-LEFT LOWER LOBE SUPERIOR SEGMENTECTOMY (Left) NODE DISSECTION (Left) INTERCOSTAL NERVE BLOCK (Left) Subjective: No complaints this AM, Ate breakfast, minimal discomfort  Objective: Vital signs in last 24 hours: Temp:  [97.8 F (36.6 C)-98.5 F (36.9 C)] 98.3 F (36.8 C) (12/17 0526) Pulse Rate:  [63-94] 93 (12/17 0000) Cardiac Rhythm: Normal sinus rhythm;Heart block (12/17 0700) Resp:  [15-20] 20 (12/17 0526) BP: (103-152)/(62-88) 103/69 (12/17 0526) SpO2:  [91 %-100 %] 96 % (12/17 0526) Weight:  [104.8 kg] 104.8 kg (12/16 1005)  Hemodynamic parameters for last 24 hours:    Intake/Output from previous day: 12/16 0701 - 12/17 0700 In: 2252.7 [P.O.:480; I.V.:1472.7; IV Piggyback:300] Out: 1760 [Urine:1700; Blood:50; Chest Tube:10] Intake/Output this shift: No intake/output data recorded.  General appearance: alert, cooperative, and no distress Neurologic: intact Heart: regular rate and rhythm Lungs: clear to auscultation bilaterally No air leak  Lab Results: Recent Labs    01/25/23 0224  WBC 9.8  HGB 12.3*  HCT 35.8*  PLT 183   BMET:  Recent Labs    01/25/23 0224  NA 135  K 3.8  CL 106  CO2 20*  GLUCOSE 154*  BUN 13  CREATININE 0.89  CALCIUM 8.0*    PT/INR: No results for input(s): "LABPROT", "INR" in the last 72 hours. ABG No results found for: "PHART", "HCO3", "TCO2", "ACIDBASEDEF", "O2SAT" CBG (last 3)  No results for input(s): "GLUCAP" in the last 72 hours.  Assessment/Plan: S/P Procedure(s) (LRB): XI ROBOTIC ASSISTED THORACOSCOPY-LEFT LOWER LOBE SUPERIOR SEGMENTECTOMY (Left) NODE DISSECTION (Left) INTERCOSTAL NERVE BLOCK (Left) Doing well POD # 1 Pain well controlled Doing well with IS No air leak and CXR looks good- dc chest tube SCD + enoxaparin Ambulate Likely home tomorrow   LOS: 1 day    Loreli Slot 01/25/2023

## 2023-01-25 NOTE — Plan of Care (Signed)
  Problem: Education: Goal: Knowledge of General Education information will improve Description: Including pain rating scale, medication(s)/side effects and non-pharmacologic comfort measures Outcome: Progressing   Problem: Health Behavior/Discharge Planning: Goal: Ability to manage health-related needs will improve Outcome: Progressing   Problem: Clinical Measurements: Goal: Ability to maintain clinical measurements within normal limits will improve Outcome: Progressing Goal: Will remain free from infection Outcome: Progressing Goal: Diagnostic test results will improve Outcome: Progressing Goal: Respiratory complications will improve Outcome: Progressing Goal: Cardiovascular complication will be avoided Outcome: Progressing   Problem: Activity: Goal: Risk for activity intolerance will decrease Outcome: Progressing   Problem: Nutrition: Goal: Adequate nutrition will be maintained Outcome: Progressing   Problem: Coping: Goal: Level of anxiety will decrease Outcome: Progressing   Problem: Elimination: Goal: Will not experience complications related to bowel motility Outcome: Progressing Goal: Will not experience complications related to urinary retention Outcome: Progressing   Problem: Pain Management: Goal: General experience of comfort will improve Outcome: Progressing   Problem: Safety: Goal: Ability to remain free from injury will improve Outcome: Progressing   Problem: Skin Integrity: Goal: Risk for impaired skin integrity will decrease Outcome: Progressing   Problem: Education: Goal: Knowledge of disease or condition will improve Outcome: Progressing Goal: Knowledge of the prescribed therapeutic regimen will improve Outcome: Progressing   Problem: Activity: Goal: Risk for activity intolerance will decrease Outcome: Progressing   Problem: Cardiac: Goal: Will achieve and/or maintain hemodynamic stability Outcome: Progressing   Problem: Clinical  Measurements: Goal: Postoperative complications will be avoided or minimized Outcome: Progressing   Problem: Respiratory: Goal: Respiratory status will improve Outcome: Progressing   Problem: Pain Management: Goal: Pain level will decrease Outcome: Progressing   Problem: Skin Integrity: Goal: Wound healing without signs and symptoms infection will improve Outcome: Progressing

## 2023-01-26 ENCOUNTER — Inpatient Hospital Stay (HOSPITAL_COMMUNITY): Payer: Medicare Other

## 2023-01-26 ENCOUNTER — Institutional Professional Consult (permissible substitution): Payer: Medicare Other | Admitting: Pulmonary Disease

## 2023-01-26 LAB — COMPREHENSIVE METABOLIC PANEL
ALT: 28 U/L (ref 0–44)
AST: 47 U/L — ABNORMAL HIGH (ref 15–41)
Albumin: 3.1 g/dL — ABNORMAL LOW (ref 3.5–5.0)
Alkaline Phosphatase: 41 U/L (ref 38–126)
Anion gap: 9 (ref 5–15)
BUN: 16 mg/dL (ref 8–23)
CO2: 24 mmol/L (ref 22–32)
Calcium: 8.3 mg/dL — ABNORMAL LOW (ref 8.9–10.3)
Chloride: 103 mmol/L (ref 98–111)
Creatinine, Ser: 0.87 mg/dL (ref 0.61–1.24)
GFR, Estimated: >60 mL/min (ref >60)
Glucose, Bld: 103 mg/dL — ABNORMAL HIGH (ref 70–99)
Potassium: 3.9 mmol/L (ref 3.5–5.1)
Sodium: 136 mmol/L (ref 135–145)
Total Bilirubin: 1.1 mg/dL (ref <1.2)
Total Protein: 5.3 g/dL — ABNORMAL LOW (ref 6.5–8.1)

## 2023-01-26 LAB — CBC
HCT: 33.9 % — ABNORMAL LOW (ref 39.0–52.0)
Hemoglobin: 11.5 g/dL — ABNORMAL LOW (ref 13.0–17.0)
MCH: 31.2 pg (ref 26.0–34.0)
MCHC: 33.9 g/dL (ref 30.0–36.0)
MCV: 91.9 fL (ref 80.0–100.0)
Platelets: 171 10*3/uL (ref 150–400)
RBC: 3.69 MIL/uL — ABNORMAL LOW (ref 4.22–5.81)
RDW: 12.5 % (ref 11.5–15.5)
WBC: 7.7 10*3/uL (ref 4.0–10.5)
nRBC: 0 % (ref 0.0–0.2)

## 2023-01-26 LAB — SURGICAL PATHOLOGY

## 2023-01-26 MED ORDER — OXYCODONE HCL 5 MG PO TABS: 5.0000 mg | ORAL_TABLET | Freq: Four times a day (QID) | ORAL | 0 refills | Status: DC | PRN

## 2023-01-26 MED ORDER — POTASSIUM CHLORIDE CRYS ER 20 MEQ PO TBCR
20.0000 meq | EXTENDED_RELEASE_TABLET | Freq: Once | ORAL | Status: AC
  Administered 2023-01-26: 20 meq via ORAL
  Filled 2023-01-26: qty 1

## 2023-01-26 MED ORDER — GABAPENTIN 300 MG PO CAPS: 300.0000 mg | ORAL_CAPSULE | Freq: Every day | ORAL | 0 refills | Status: DC

## 2023-01-26 MED ORDER — ASPIRIN 81 MG PO TBEC: 81.0000 mg | DELAYED_RELEASE_TABLET | Freq: Every day | ORAL | Status: AC

## 2023-01-26 NOTE — Progress Notes (Signed)
Patient had AVS gone over with, any/all questions answered, belongings gathered, ivs removed and patient was rolled out to family vehicle.

## 2023-01-26 NOTE — Progress Notes (Addendum)
      [address]       [place] [address]             [phone]       2 Days Post-Op Procedure(s) (LRB): XI ROBOTIC ASSISTED THORACOSCOPY-LEFT LOWER LOBE SUPERIOR SEGMENTECTOMY (Left) NODE DISSECTION (Left) INTERCOSTAL NERVE BLOCK (Left)  Subjective: Patient eating breakfast this am. He has no specific complaint.  Objective: Vital signs in last 24 hours: Temp:  [97.9 F (36.6 C)-98.1 F (36.7 C)] 98 F (36.7 C) (12/17 1918) Pulse Rate:  [75-77] 77 (12/17 1918) Cardiac Rhythm: Normal sinus rhythm;Heart block (12/18 0700) Resp:  [18-20] 18 (12/17 1918) BP: (104-110)/(61-96) 110/96 (12/17 1918) SpO2:  [92 %-95 %] 95 % (12/17 1918)     Intake/Output from previous day: 12/17 0701 - 12/18 0700 In: 480 [P.O.:480] Out: 1150 [Urine:1150]   Physical Exam:  Cardiovascular: RRR, Pulmonary: Clear to auscultation bilaterally Abdomen: Soft, non tender, bowel sounds present. Extremities: No  lower extremity edema. Wounds: Clean and dry.  No erythema or signs of infection.   Lab Results: CBC: Recent Labs    01/25/23 0224 01/26/23 0237  WBC 9.8 7.7  HGB 12.3* 11.5*  HCT 35.8* 33.9*  PLT 183 171   BMET:  Recent Labs    01/25/23 0224 01/26/23 0237  NA 135 136  K 3.8 3.9  CL 106 103  CO2 20* 24  GLUCOSE 154* 103*  BUN 13 16  CREATININE 0.89 0.87  CALCIUM 8.0* 8.3*    PT/INR: No results for input(s): "LABPROT", "INR" in the last 72 hours. ABG:  INR: Will add last result for INR, ABG once components are confirmed Will add last 4 CBG results once components are confirmed  Assessment/Plan:  1. CV - SR, first degree heart block.  2.  Pulmonary - On room air. Chest tube removed yesterday. PA/LAT CXR this am appears stable (small left apical pneumothorax,atelectasis left base). Encourage incentive spirometer. Await final pathology. 3. On Lovenox for DVT prophylaxis 4. Supplement potassium 5. Discharge  Donielle M  ZimmermanPA-C 01/26/2023,7:14 AM  Patient seen and examined, agree with findings and plan outlined above Path still pending Looks great- home today  Salvatore Decent. Dorris Fetch, MD [facility] [phone]

## 2023-01-26 NOTE — TOC Transition Note (Signed)
Transition of Care [facility]) - Discharge Note   Patient Details  Name: Cody Michael MRN: [id number] Date of Birth: 1945/09/21  Transition of [facility]) CM/SW Contact:  Harriet Masson, RN Phone Number: 01/26/2023, 12:04 PM   Clinical Narrative:    Patient stable for discharge.  No TOC needs at this time.    Final next level of care: Home/Self Care Barriers to Discharge: Barriers Resolved   Patient Goals and CMS Choice Patient states their goals for this hospitalization and ongoing recovery are:: return home          Discharge Placement             home          Discharge Plan and Services Additional resources added to the After Visit Summary for     Discharge Planning Services: CM Consult                                 Social Drivers of Health (SDOH) Interventions SDOH Screenings   Food Insecurity: No Food Insecurity (01/24/2023)  Housing: Patient Declined (01/24/2023)  Transportation Needs: No Transportation Needs (01/24/2023)  Utilities: Not At Risk (01/24/2023)  Tobacco Use: Medium Risk (01/24/2023)     Readmission Risk Interventions     No data to display

## 2023-01-26 NOTE — Plan of Care (Signed)
  Problem: Education: Goal: Knowledge of General Education information will improve Description: Including pain rating scale, medication(s)/side effects and non-pharmacologic comfort measures Outcome: Progressing   Problem: Health Behavior/Discharge Planning: Goal: Ability to manage health-related needs will improve Outcome: Progressing   Problem: Clinical Measurements: Goal: Ability to maintain clinical measurements within normal limits will improve Outcome: Progressing Goal: Will remain free from infection Outcome: Progressing Goal: Diagnostic test results will improve Outcome: Progressing Goal: Respiratory complications will improve Outcome: Progressing Goal: Cardiovascular complication will be avoided Outcome: Progressing   Problem: Activity: Goal: Risk for activity intolerance will decrease Outcome: Progressing   Problem: Nutrition: Goal: Adequate nutrition will be maintained Outcome: Progressing   Problem: Coping: Goal: Level of anxiety will decrease Outcome: Progressing   Problem: Elimination: Goal: Will not experience complications related to bowel motility Outcome: Progressing Goal: Will not experience complications related to urinary retention Outcome: Progressing   Problem: Pain Management: Goal: General experience of comfort will improve Outcome: Progressing   Problem: Safety: Goal: Ability to remain free from injury will improve Outcome: Progressing   Problem: Skin Integrity: Goal: Risk for impaired skin integrity will decrease Outcome: Progressing   Problem: Education: Goal: Knowledge of disease or condition will improve Outcome: Progressing Goal: Knowledge of the prescribed therapeutic regimen will improve Outcome: Progressing   Problem: Activity: Goal: Risk for activity intolerance will decrease Outcome: Progressing   Problem: Cardiac: Goal: Will achieve and/or maintain hemodynamic stability Outcome: Progressing   Problem: Clinical  Measurements: Goal: Postoperative complications will be avoided or minimized Outcome: Progressing   Problem: Respiratory: Goal: Respiratory status will improve Outcome: Progressing   Problem: Pain Management: Goal: Pain level will decrease Outcome: Progressing   Problem: Skin Integrity: Goal: Wound healing without signs and symptoms infection will improve Outcome: Progressing

## 2023-02-07 DIAGNOSIS — Z4802 Encounter for removal of sutures: Secondary | ICD-10-CM

## 2023-02-15 ENCOUNTER — Other Ambulatory Visit: Payer: Self-pay | Admitting: Thoracic Surgery (Cardiothoracic Vascular Surgery)

## 2023-02-15 DIAGNOSIS — R911 Solitary pulmonary nodule: Secondary | ICD-10-CM

## 2023-02-16 ENCOUNTER — Ambulatory Visit
Admission: RE | Admit: 2023-02-16 | Discharge: 2023-02-16 | Disposition: A | Discharge status: Home / Self Care | Payer: Medicare Other | Source: Ambulatory Visit | Attending: Thoracic Surgery (Cardiothoracic Vascular Surgery) | Admitting: Thoracic Surgery (Cardiothoracic Vascular Surgery)

## 2023-02-16 ENCOUNTER — Ambulatory Visit (INDEPENDENT_AMBULATORY_CARE_PROVIDER_SITE_OTHER): Payer: Self-pay | Admitting: Thoracic Surgery (Cardiothoracic Vascular Surgery)

## 2023-02-16 ENCOUNTER — Encounter: Payer: Self-pay | Admitting: Thoracic Surgery (Cardiothoracic Vascular Surgery)

## 2023-02-16 VITALS — BP 131/76 | HR 79 | Resp 18 | Ht 72.0 in

## 2023-02-16 DIAGNOSIS — R911 Solitary pulmonary nodule: Secondary | ICD-10-CM

## 2023-02-16 DIAGNOSIS — Z09 Encounter for follow-up examination after completed treatment for conditions other than malignant neoplasm: Secondary | ICD-10-CM

## 2023-02-16 NOTE — Progress Notes (Signed)
 [address]       [place] [address]             [phone]     HPI: Cody Michael returns for follow-up after recent lung resection.  Cody Michael is a 78 year old gentleman with a history of hyperlipidemia, coronary calcification, sleep apnea, and lung nodules.  He smoked about a pack a day for 25 years prior to quitting 25 years ago.  Had a CT for coronary calcium  screening in January 2023 and was noted to have a right middle lobe lung nodule.  A follow-up CT in a year later showed that nodule stable but there was a new groundglass opacity in the superior segment left lower lobe.  Follow-up CT of that in August 2024 showed the nodule was about the same size but there was a new solid component.  PET/CT did not show significant metabolic activity.  After discussion of options including radiographic follow-up, bronchoscopic biopsy, and resection, he wished to proceed with surgical resection.  I did a left lower lobe superior segmentectomy on 01/24/2023.  He did well postoperatively and went home on day 2.  Pathology showed bronchial metaplasia.  He feels well.  Occasionally has some pins-and-needles type sensation.  Not taking gabapentin  or oxycodone .  Occasionally using Tylenol .  No respiratory issues.  Overall feels well and wants to resume activities.  Past Medical History:  Diagnosis Date   Allergy    seasonal   Asthma    Cataract    Coronary artery calcification seen on CAT scan 01/05/2023   Hyperlipidemia    Sleep apnea    cpap    Current Outpatient Medications  Medication Sig Dispense Refill   aspirin  EC 81 MG tablet Take 1 tablet (81 mg total) by mouth at bedtime. Swallow whole.     atorvastatin  (LIPITOR) 20 MG tablet Take 20 mg by mouth daily.     BEE POLLEN PO Take 2 capsules by mouth daily.     Cholecalciferol (VITAMIN D-3) 125 MCG (5000 UT) TABS Take 5,000 Units by mouth daily.     Multiple Vitamin (MULTIVITAMIN) tablet Take 1 tablet by mouth  at bedtime.     Multiple Vitamins-Minerals (OCUVITE ADULT 50+ PO) Take 1 tablet by mouth daily.     Omega-3 Fatty Acids (FISH OIL ODOR-LESS PO) Take 1 capsule by mouth daily. Triple Omega     Probiotic Product (PROBIOTIC PO) Take 1 tablet by mouth daily. 900 billion     Semaglutide, 2 MG/DOSE, (OZEMPIC, 2 MG/DOSE,) 8 MG/3ML SOPN Inject 2 mg into the skin once a week.     No current facility-administered medications for this visit.    Physical Exam BP 131/76   Pulse 79   Resp 18   Ht 6' (1.829 m)   SpO2 95% Comment: RA  BMI 31.36 kg/m  78 year old man in no acute distress Alert and oriented x 3 with no focal deficits Lungs clear with equal breath sounds bilaterally Incisions clean dry and intact  Diagnostic Tests: I reviewed his chest x-ray images.  No significant effusion or infiltrate.  Impression: Cody Michael is a 78 year old gentleman with a history of hyperlipidemia, coronary calcification, sleep apnea, and lung nodules.  He smoked about a pack a day for 25 years prior to quitting 25 years ago.   Left lower lobe lung nodule-pathology showed bronchial metaplasia, smooth muscle metaplasia, and reactive pneumocyte atypia.  Benign lesion.  Will plan a repeat CT in a year.  He may resume his normal activities gradually over time.  He may begin driving.  Appropriate precautions were discussed.  Plan: Return in 1 year with a low-dose CT of the chest. He knows to call if he has any issues related to his surgery  Elspeth JAYSON Millers, MD [facility] [phone]

## 2023-03-25 IMAGING — CT CT CARDIAC CORONARY ARTERY CALCIUM SCORE
3 series · 14 of 20 positions shown, 16 images · non-contrast
Comparison: None.

CLINICAL DATA: 75-year-old Caucasian male with family history of
high cholesterol.

EXAM:
CT CARDIAC CORONARY ARTERY CALCIUM SCORE
TECHNIQUE: Non-contrast imaging through the heart was performed using
prospective ECG gating. Image post processing was performed on an
independent workstation, allowing for quantitative analysis of the
heart and coronary arteries. Note that this exam targets the heart
and the chest was not imaged in its entirety.

[Series 2: calcium scoring 2.00 qr36 bestdiast 71% hrt calciu · axial · 0.45mm/px · z∈[+1713,+1809]mm · 4 of 80 slices shown]
[im 16/80  vessel]
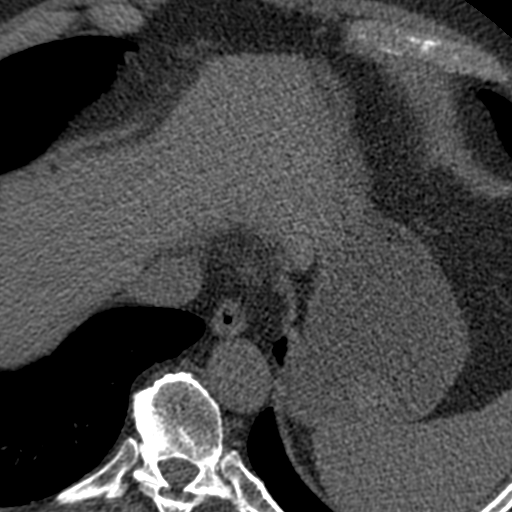
[im 32/80  vessel]
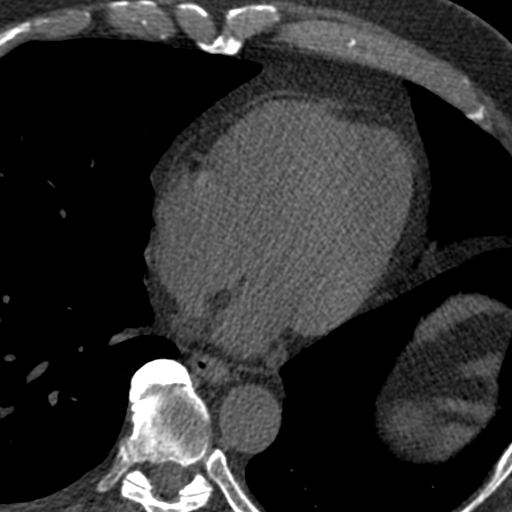
[im 48/80  vessel]
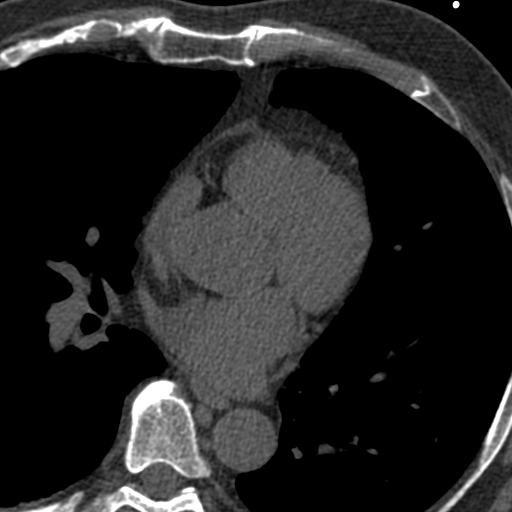
[im 64/80  vessel]
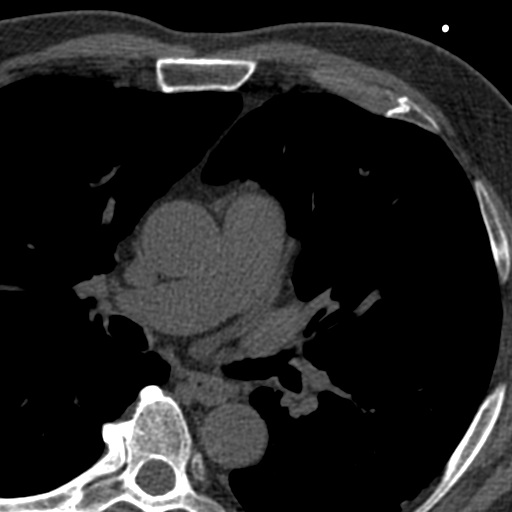

[Series 3: calcium scoring 2.00 br40 bestdiast 71% axial · axial · 0.66mm/px · z∈[+1709,+1813]mm · 5 of 80 slices shown, 7 images]
[im 14/80  vessel]
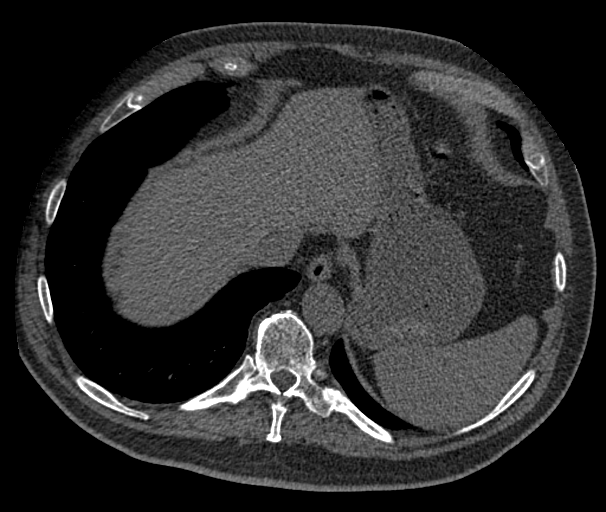
[im 14/80  lung]
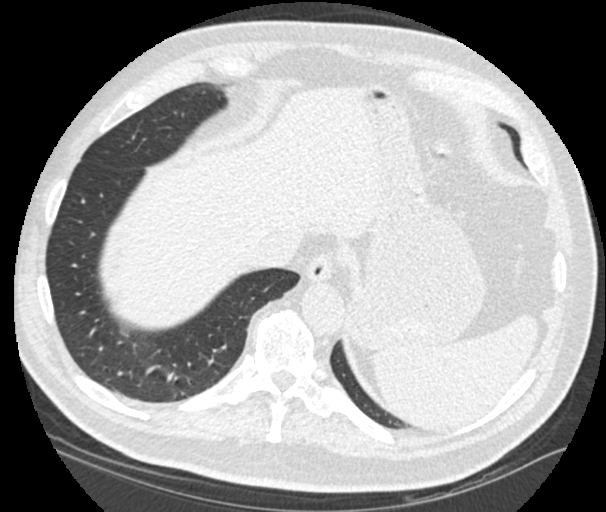
[im 27/80  vessel]
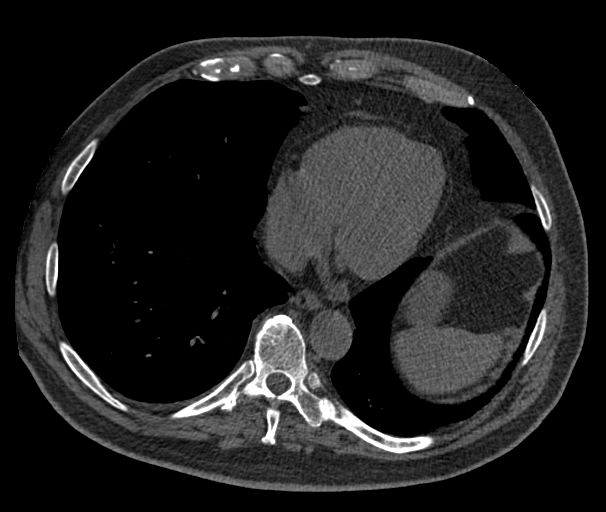
[im 40/80  vessel]
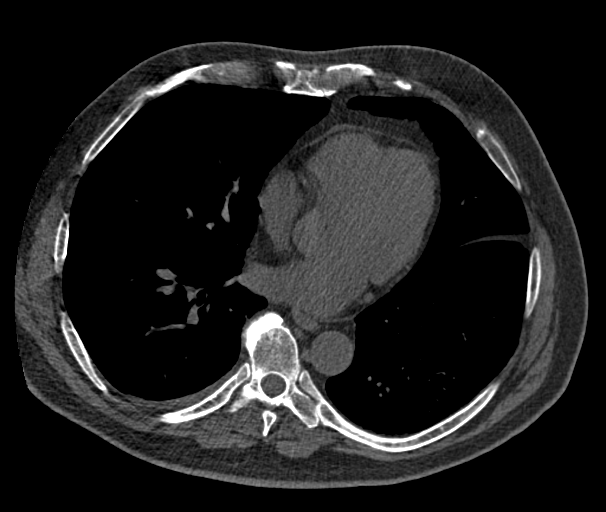
[im 53/80  vessel]
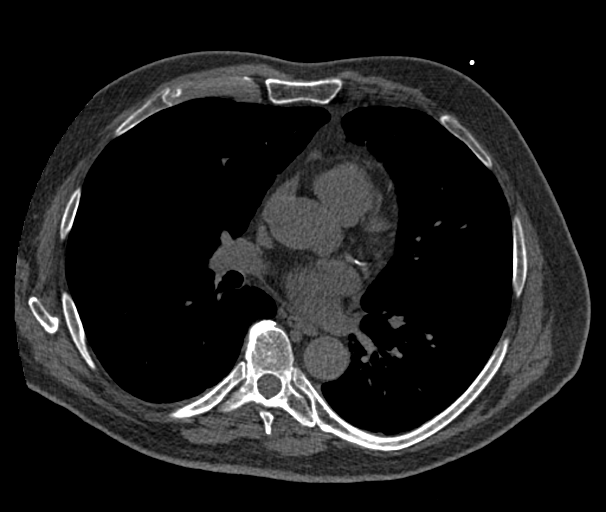
[im 66/80  vessel]
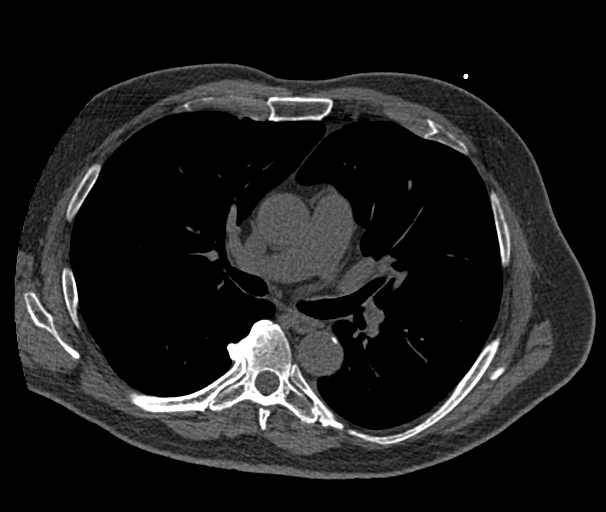
[im 66/80  lung]
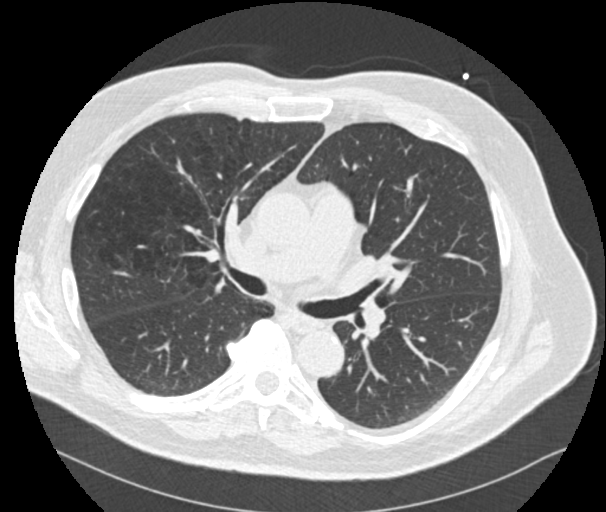

[Series 9: calcium scoring 2.00 br60 bestdiast 71% lungs · axial · 0.65mm/px · z∈[+1709,+1813]mm · 5 of 80 slices shown]
[im 14/80  vessel]
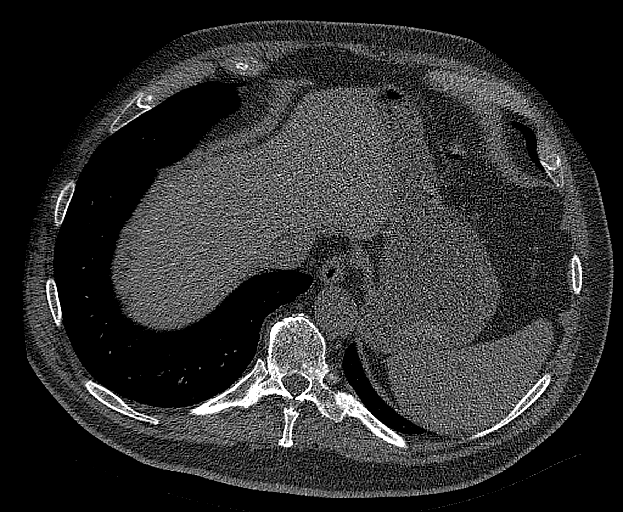
[im 27/80  vessel]
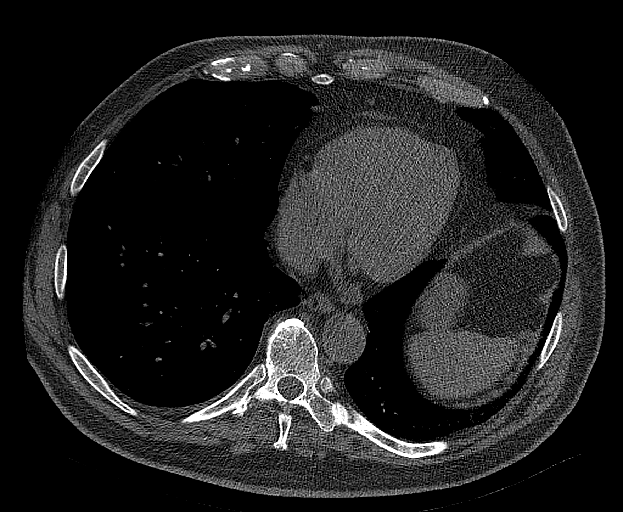
[im 40/80  vessel]
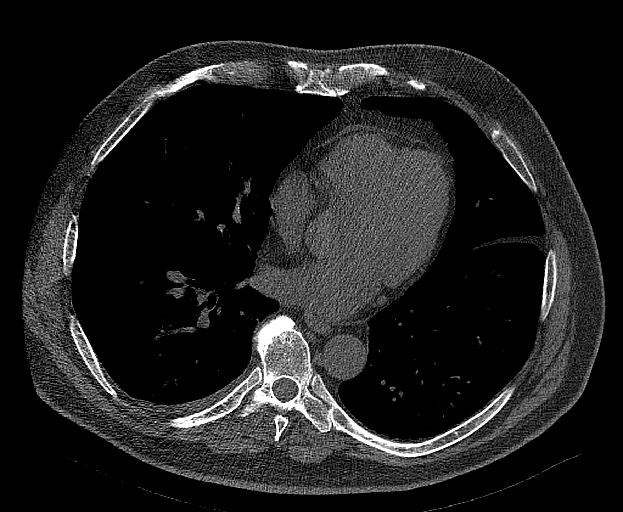
[im 53/80  vessel]
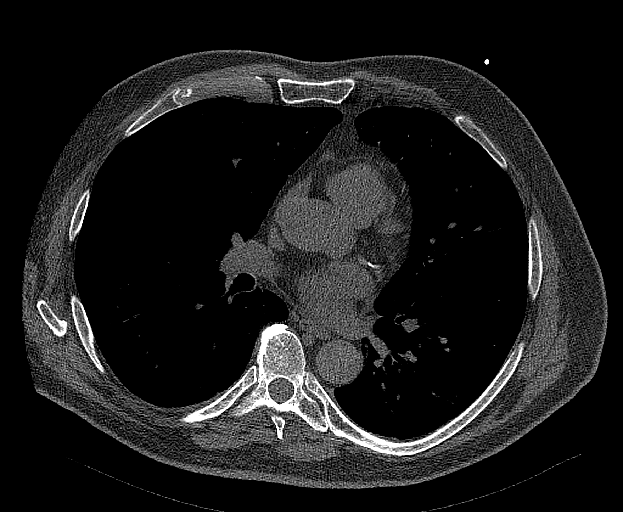
[im 66/80  vessel]
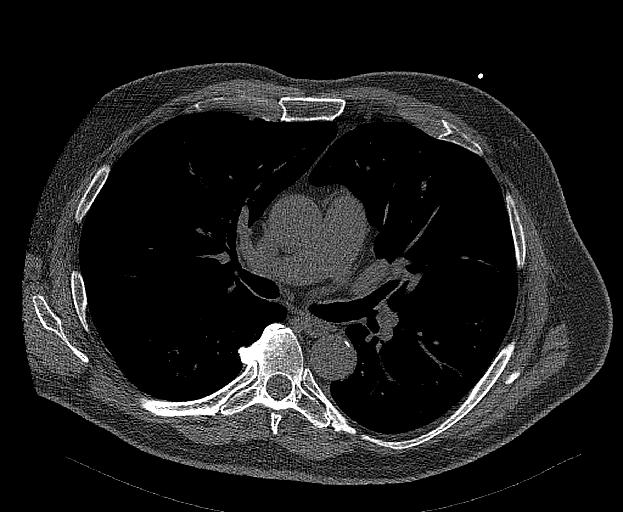

[14 of 20 positions shown; findings below may reference images not displayed]

FINDINGS: Technical quality: Good

CORONARY CALCIUM SCORES:

Left Main: 23

LAD: 77

LCx: 139

RCA: No coronary calcification

CORONARY CALCIUM

Total Agatston Score: 238

[HOSPITAL] percentile: 47

Ascending aorta (normal <  40 mm): 34 mm

EXTRACARDIAC FINDINGS:

Limited view of the lung parenchyma demonstrates a flattened 6 mm
nodule in RIGHT middle lobe along the horizontal fissure (image
32/series 9). Centrilobular emphysema in the RIGHT upper lobe.
Airways are normal.

Limited view of the mediastinum demonstrates no adenopathy.
Esophagus normal.

Limited view of the upper abdomen is unremarkable.

Limited view of the skeleton and chest wall is unremarkable.
IMPRESSION: 1. LAD and LEFT circumflex coronary artery calcification.

2. Total Agatston Score: 238

3. MESA age and sex matched database percentile: 47

4. Flattened benign appearing RIGHT middle lobe pulmonary nodule.
Favor intra fissural node. Non-contrast chest CT at 6-12 months is
recommended. If the nodule is stable at time of repeat CT, then
future CT at 18-24 months (from today's scan) is considered optional
for low-risk patients, but is recommended for high-risk patients.
This recommendation follows the consensus statement: Guidelines for
Management of Incidental Pulmonary Nodules Detected on CT
Images:From the [HOSPITAL] 0979; published online before
print (10.1148/radiol.[id number]).

## 2024-01-20 ENCOUNTER — Other Ambulatory Visit: Payer: Self-pay | Admitting: Thoracic Surgery (Cardiothoracic Vascular Surgery)

## 2024-01-20 DIAGNOSIS — Z09 Encounter for follow-up examination after completed treatment for conditions other than malignant neoplasm: Secondary | ICD-10-CM

## 2024-01-20 DIAGNOSIS — R911 Solitary pulmonary nodule: Secondary | ICD-10-CM

## 2024-02-08 ENCOUNTER — Ambulatory Visit (HOSPITAL_COMMUNITY)
Admission: RE | Admit: 2024-02-08 | Discharge: 2024-02-08 | Disposition: A | Discharge status: Home / Self Care | Source: Ambulatory Visit | Attending: Student in an Organized Health Care Education/Training Program | Admitting: Student in an Organized Health Care Education/Training Program

## 2024-02-08 DIAGNOSIS — Z09 Encounter for follow-up examination after completed treatment for conditions other than malignant neoplasm: Secondary | ICD-10-CM | POA: Insufficient documentation

## 2024-02-08 DIAGNOSIS — R911 Solitary pulmonary nodule: Secondary | ICD-10-CM | POA: Diagnosis present

## 2024-02-23 ENCOUNTER — Ambulatory Visit

## 2024-02-23 VITALS — BP 127/68 | HR 72 | Resp 20 | Ht 72.0 in | Wt 220.0 lb

## 2024-02-23 DIAGNOSIS — R918 Other nonspecific abnormal finding of lung field: Secondary | ICD-10-CM

## 2024-02-23 NOTE — Patient Instructions (Signed)
-  Follow up in one year with CT scan of chest

## 2024-02-23 NOTE — Progress Notes (Signed)
 "      [address] [place] [address]             [phone]            Cody Michael [id number] 12-15-1945   History of Present Illness:  Cody Michael is a 79 year old gentleman with a history of hyperlipidemia, coronary calcification, sleep apnea, and lung nodules. He smoked about a pack a day for 25 years prior to quitting 25 years ago. He underwent left a lower lobe superior segmentectomy on 01/24/2023 with Dr. Kerrin. He did well postoperatively and went home on day 2. Pathology showed bronchial metaplasia. He presents today for continued follow up after low dose CT scan of chest.   He reports that he has been doing well and overall has been feeling fine. He states that he has no pain or numbness at the incision site. He denies shortness of breath, cough, wheezing and chest pain.       Medications Ordered Prior to Encounter[1]   ROS: Review of Systems  Constitutional:  Negative for fever and malaise/fatigue.  Respiratory:  Negative for cough, shortness of breath and wheezing.   Cardiovascular:  Negative for chest pain, palpitations and leg swelling.     BP 127/68   Pulse 72   Resp 20   Ht 6' (1.829 m)   Wt 220 lb (99.8 kg)   SpO2 95% Comment: RA  BMI 29.84 kg/m   Physical Exam Constitutional:      Appearance: Normal appearance.  HENT:     Head: Normocephalic and atraumatic.  Cardiovascular:     Rate and Rhythm: Normal rate and regular rhythm.     Heart sounds: Normal heart sounds, S1 normal and S2 normal.  Pulmonary:     Effort: Pulmonary effort is normal.     Breath sounds: Normal breath sounds.  Skin:    General: Skin is warm and dry.  Neurological:     General: No focal deficit present.     Mental Status: He is alert and oriented to person, place, and time.      Imaging: EXAM: CT CHEST WITHOUT CONTRAST 02/08/2024 08:32:41 AM   TECHNIQUE: CT of the chest was performed without the administration of  intravenous contrast. Multiplanar reformatted images are provided for review. Automated exposure control, iterative reconstruction, and/or weight based adjustment of the mA/kV was utilized to reduce the radiation dose to as low as reasonably achievable.   COMPARISON: None available.   CLINICAL HISTORY: Lung nodule, 6-25mm. Lung nodule, 6-3mm.   FINDINGS:   MEDIASTINUM: Heart and pericardium are unremarkable. Scattered coronary calcifications. The central airways are clear.   LYMPH NODES: No mediastinal, hilar or axillary lymphadenopathy.   LUNGS AND PLEURA: Interval postop changes and staple line involving superior segment left lower lobe. Pulmonary emphysema. 3 mm subpleural nodule, lateral right upper lobe (302:31). Perifissural nodules abutting the anterior aspect of the minor fissure. No pleural effusion or pneumothorax.   SOFT TISSUES/BONES: Vertebral endplate spurring at multiple levels in the lower thoracic spine.   UPPER ABDOMEN: Limited images of the upper abdomen demonstrate a small hiatal hernia.   IMPRESSION: 1. 3 mm subpleural nodule in the lateral right upper lobe. 2. Perifissural nodules abutting the anterior aspect of the minor fissure. 3. Interval postoperative changes with staple line in the superior segment of the left lower lobe. 4. Pulmonary emphysema is an independent risk factor for lung cancer, and recommend consideration for evaluation for a  low-dose CT lung cancer screening program.   Electronically signed by: Katheleen Faes MD 02/08/2024 11:55 AM EST RP Workstation: [id number]     A/P:  Lung nodules -Stable postoperative changes to the superior segment of the left lower lobe.  -3 mm subpleural nodule in the later right upper lobe and perifissural nodules adjacent to the anterior aspect of the minor fissure.  -Will continue yearly low dose CT scans for continued monitoring of pulmonary nodules and due to smoking  history         Manuelita CHRISTELLA Rough, PA-C 02/23/24     [1]  Current Outpatient Medications on File Prior to Visit  Medication Sig Dispense Refill   aspirin  EC 81 MG tablet Take 1 tablet (81 mg total) by mouth at bedtime. Swallow whole.     atorvastatin  (LIPITOR) 20 MG tablet Take 20 mg by mouth daily.     BEE POLLEN PO Take 2 capsules by mouth daily.     Cholecalciferol (VITAMIN D-3) 125 MCG (5000 UT) TABS Take 5,000 Units by mouth daily.     Multiple Vitamin (MULTIVITAMIN) tablet Take 1 tablet by mouth at bedtime.     Multiple Vitamins-Minerals (OCUVITE ADULT 50+ PO) Take 1 tablet by mouth daily.     Omega-3 Fatty Acids (FISH OIL ODOR-LESS PO) Take 1 capsule by mouth daily. Triple Omega     Probiotic Product (PROBIOTIC PO) Take 1 tablet by mouth daily. 900 billion     Semaglutide, 2 MG/DOSE, (OZEMPIC, 2 MG/DOSE,) 8 MG/3ML SOPN Inject 2 mg into the skin once a week.     No current facility-administered medications on file prior to visit.   "
